# Patient Record
Sex: Female | Born: 1970 | Race: Black or African American | Hispanic: No | Smoking: Light tobacco smoker
Health system: Southern US, Community
[De-identification: ages and names within clinical notes are randomized; demographics above are authoritative.]

## PROBLEM LIST (undated history)

## (undated) DIAGNOSIS — G5601 Carpal tunnel syndrome, right upper limb: Secondary | ICD-10-CM

## (undated) DIAGNOSIS — R7303 Prediabetes: Secondary | ICD-10-CM

## (undated) HISTORY — DX: Carpal tunnel syndrome, right upper limb: G56.01

---

## 1998-03-19 ENCOUNTER — Other Ambulatory Visit: Admission: RE | Admit: 1998-03-19 | Discharge: 1998-03-19 | Payer: Self-pay | Admitting: Obstetrics

## 1998-04-11 ENCOUNTER — Emergency Department (HOSPITAL_COMMUNITY): Admission: EM | Admit: 1998-04-11 | Discharge: 1998-04-11 | Payer: Self-pay | Admitting: Emergency Medicine

## 1998-04-11 ENCOUNTER — Encounter: Payer: Self-pay | Admitting: Emergency Medicine

## 2000-10-01 ENCOUNTER — Emergency Department (HOSPITAL_COMMUNITY): Admission: EM | Admit: 2000-10-01 | Discharge: 2000-10-01 | Payer: Self-pay | Admitting: Emergency Medicine

## 2002-02-12 ENCOUNTER — Emergency Department (HOSPITAL_COMMUNITY): Admission: EM | Admit: 2002-02-12 | Discharge: 2002-02-13 | Payer: Self-pay | Admitting: Emergency Medicine

## 2003-03-22 ENCOUNTER — Emergency Department (HOSPITAL_COMMUNITY): Admission: EM | Admit: 2003-03-22 | Discharge: 2003-03-22 | Payer: Self-pay | Admitting: *Deleted

## 2003-03-22 ENCOUNTER — Encounter: Payer: Self-pay | Admitting: Emergency Medicine

## 2004-09-01 ENCOUNTER — Emergency Department (HOSPITAL_COMMUNITY): Admission: EM | Admit: 2004-09-01 | Discharge: 2004-09-01 | Payer: Self-pay | Admitting: Emergency Medicine

## 2004-09-05 ENCOUNTER — Encounter: Admission: RE | Admit: 2004-09-05 | Discharge: 2004-09-05 | Payer: Self-pay | Admitting: Family Medicine

## 2004-12-09 ENCOUNTER — Other Ambulatory Visit: Admission: RE | Admit: 2004-12-09 | Discharge: 2004-12-09 | Payer: Self-pay | Admitting: *Deleted

## 2006-07-20 ENCOUNTER — Other Ambulatory Visit: Admission: RE | Admit: 2006-07-20 | Discharge: 2006-07-20 | Payer: Self-pay | Admitting: *Deleted

## 2008-01-28 ENCOUNTER — Encounter: Admission: RE | Admit: 2008-01-28 | Discharge: 2008-01-28 | Payer: Self-pay | Admitting: Family Medicine

## 2008-03-01 ENCOUNTER — Other Ambulatory Visit: Admission: RE | Admit: 2008-03-01 | Discharge: 2008-03-01 | Payer: Self-pay | Admitting: Family Medicine

## 2008-03-07 ENCOUNTER — Encounter: Admission: RE | Admit: 2008-03-07 | Discharge: 2008-03-07 | Payer: Self-pay | Admitting: Family Medicine

## 2009-09-13 ENCOUNTER — Encounter: Admission: RE | Admit: 2009-09-13 | Discharge: 2009-09-13 | Payer: Self-pay | Admitting: Family Medicine

## 2009-12-11 ENCOUNTER — Encounter: Admission: RE | Admit: 2009-12-11 | Discharge: 2009-12-11 | Payer: Self-pay | Admitting: Family Medicine

## 2010-01-17 ENCOUNTER — Other Ambulatory Visit: Admission: RE | Admit: 2010-01-17 | Discharge: 2010-01-17 | Payer: Self-pay | Admitting: Family Medicine

## 2011-07-10 ENCOUNTER — Ambulatory Visit (INDEPENDENT_AMBULATORY_CARE_PROVIDER_SITE_OTHER): Payer: BC Managed Care – PPO | Admitting: Family Medicine

## 2011-07-10 VITALS — BP 120/73 | HR 83 | Temp 97.6°F | Resp 18 | Ht 61.0 in | Wt 176.0 lb

## 2011-07-10 DIAGNOSIS — R300 Dysuria: Secondary | ICD-10-CM

## 2011-07-10 DIAGNOSIS — L309 Dermatitis, unspecified: Secondary | ICD-10-CM

## 2011-07-10 DIAGNOSIS — L301 Dyshidrosis [pompholyx]: Secondary | ICD-10-CM

## 2011-07-10 DIAGNOSIS — N760 Acute vaginitis: Secondary | ICD-10-CM

## 2011-07-10 DIAGNOSIS — B9689 Other specified bacterial agents as the cause of diseases classified elsewhere: Secondary | ICD-10-CM

## 2011-07-10 LAB — POCT WET PREP WITH KOH
Epithelial Wet Prep HPF POC: NEGATIVE
KOH Prep POC: NEGATIVE
RBC Wet Prep HPF POC: 0.2
Trichomonas, UA: NEGATIVE
WBC Wet Prep HPF POC: NEGATIVE
Yeast Wet Prep HPF POC: NEGATIVE

## 2011-07-10 LAB — POCT UA - MICROSCOPIC ONLY
Mucus, UA: POSITIVE
Yeast, UA: NEGATIVE

## 2011-07-10 LAB — POCT URINALYSIS DIPSTICK
Blood, UA: NEGATIVE
Protein, UA: NEGATIVE

## 2011-07-10 MED ORDER — METRONIDAZOLE 500 MG PO TABS: 500.0000 mg | ORAL_TABLET | Freq: Two times a day (BID) | ORAL | Status: AC

## 2011-07-10 MED ORDER — CLOBETASOL PROPIONATE 0.05 % EX CREA: TOPICAL_CREAM | Freq: Two times a day (BID) | CUTANEOUS | Status: AC

## 2011-07-10 NOTE — Patient Instructions (Signed)
Treat the eczema on the feet with the prescribed cream. It is not doing better she is to contact me and we would consider antifungal treatment despite normal skin scraping.  The Bacterial Vaginosis Bacterial vaginosis (BV) is a vaginal infection where the normal balance of bacteria in the vagina is disrupted. The normal balance is then replaced by an overgrowth of certain bacteria. There are several different kinds of bacteria that can cause BV. BV is the most common vaginal infection in women of childbearing age. CAUSES   The cause of BV is not fully understood. BV develops when there is an increase or imbalance of harmful bacteria.   Some activities or behaviors can upset the normal balance of bacteria in the vagina and put women at increased risk including:   Having a new sex partner or multiple sex partners.   Douching.   Using an intrauterine device (IUD) for contraception.   It is not clear what role sexual activity plays in the development of BV. However, women that have never had sexual intercourse are rarely infected with BV.  Women do not get BV from toilet seats, bedding, swimming pools or from touching objects around them.  SYMPTOMS   Grey vaginal discharge.   A fish-like odor with discharge, especially after sexual intercourse.   Itching or burning of the vagina and vulva.   Burning or pain with urination.   Some women have no signs or symptoms at all.  DIAGNOSIS  Your caregiver must examine the vagina for signs of BV. Your caregiver will perform lab tests and look at the sample of vaginal fluid through a microscope. They will look for bacteria and abnormal cells (clue cells), a pH test higher than 4.5, and a positive amine test all associated with BV.  RISKS AND COMPLICATIONS   Pelvic inflammatory disease (PID).   Infections following gynecology surgery.   Developing HIV.   Developing herpes virus.  TREATMENT  Sometimes BV will clear up without treatment.  However, all women with symptoms of BV should be treated to avoid complications, especially if gynecology surgery is planned. Female partners generally do not need to be treated. However, BV may spread between female sex partners so treatment is helpful in preventing a recurrence of BV.   BV may be treated with antibiotics. The antibiotics come in either pill or vaginal cream forms. Either can be used with nonpregnant or pregnant women, but the recommended dosages differ. These antibiotics are not harmful to the baby.   BV can recur after treatment. If this happens, a second round of antibiotics will often be prescribed.   Treatment is important for pregnant women. If not treated, BV can cause a premature delivery, especially for a pregnant woman who had a premature birth in the past. All pregnant women who have symptoms of BV should be checked and treated.   For chronic reoccurrence of BV, treatment with a type of prescribed gel vaginally twice a week is helpful.  HOME CARE INSTRUCTIONS   Finish all medication as directed by your caregiver.   Do not have sex until treatment is completed.   Tell your sexual partner that you have a vaginal infection. They should see their caregiver and be treated if they have problems, such as a mild rash or itching.   Practice safe sex. Use condoms. Only have 1 sex partner.  PREVENTION  Basic prevention steps can help reduce the risk of upsetting the natural balance of bacteria in the vagina and developing BV:  Do not have sexual intercourse (be abstinent).   Do not douche.   Use all of the medicine prescribed for treatment of BV, even if the signs and symptoms go away.   Tell your sex partner if you have BV. That way, they can be treated, if needed, to prevent reoccurrence.  SEEK MEDICAL CARE IF:   Your symptoms are not improving after 3 days of treatment.   You have increased discharge, pain, or fever.  MAKE SURE YOU:   Understand these  instructions.   Will watch your condition.   Will get help right away if you are not doing well or get worse.  FOR MORE INFORMATION  Division of STD Prevention (DSTDP), Centers for Disease Control and Prevention: [employer] American Social Health Association (ASHA): www.ashastd.org  Document Released: 05/19/2005 Document Revised: 01/29/2011 Document Reviewed: 11/09/2008 [facility] Patient Information 2012 [place], [place].BV will be treated with oral treatment

## 2011-07-10 NOTE — Progress Notes (Signed)
  Subjective:    Patient ID: Rhonda Goodman, female    DOB: [birth year], 41 y.o.   MRN: [id number]  HPI Patient has had problems for over a week of time initially some dysuria, but that subsided about a week ago. He started developing a vaginal discharge. She used an over-the-counter Monistat preparation without relief. That was less than a week ago. She last had intercourse this weekend, using condom for protection, but not a regular partner. she has a history of being positive for RPR and HSV, and has had gonorrhea in the past. Does not know of any recent STD exposure. The discharge has an odor and has been bothering her.  Also complains of a chronic fungus on her feet    Review of Systems history of chronic constipation which has been more of a problem lately.     Objective:   Physical Exam Afro-American female in no acute distress his time. Abdomen was soft without masses or tenderness. Normal female external genitalia. No obvious erythema. There is a creamy white discharge, looks more nonspecific than yeast in appearance. Cervix appears normal. Bimanual exam was normal with no adnexal or uterine masses or tenderness. Wet prep was taken. Skin scraping was also done space on the bottom of her foot.       Assessment & Plan:  Vaginitis Constipation Rash  Urinalysis wet prep Gen-Probe and skin scraping her foot were performed and are pending.

## 2011-07-11 ENCOUNTER — Encounter: Payer: Self-pay | Admitting: Family Medicine

## 2014-02-24 ENCOUNTER — Ambulatory Visit (INDEPENDENT_AMBULATORY_CARE_PROVIDER_SITE_OTHER): Payer: Self-pay | Admitting: Emergency Medicine

## 2014-02-24 VITALS — BP 122/84 | HR 73 | Temp 98.1°F | Resp 16 | Ht 60.0 in | Wt 199.6 lb

## 2014-02-24 DIAGNOSIS — Z7189 Other specified counseling: Secondary | ICD-10-CM

## 2014-02-24 DIAGNOSIS — Z7185 Encounter for immunization safety counseling: Secondary | ICD-10-CM

## 2014-02-24 DIAGNOSIS — Z23 Encounter for immunization: Secondary | ICD-10-CM

## 2014-02-24 NOTE — Progress Notes (Signed)
   Subjective:    Patient ID: Rhonda Goodman, female    DOB: 1971-04-13, 43 y.o.   MRN: [id number]  This chart was scribed for Lesle Chris, MD by Tonye Royalty, ED Scribe. This patient was seen in room 5 and the patient's care was started at 2:12 PM.   HPI  HPI Comments: Rhonda Goodman is a 43 y.o. female who presents to the [facility] to obtain titer verifications. 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 She states her last tetanus shot was in 1996.   Review of Systems     Objective:   Physical Exam CONSTITUTIONAL: Well developed/well nourished HEAD: Normocephalic/atraumatic EYES: EOMI/PERRL ENMT: Mucous membranes moist NECK: supple no meningeal signs SPINE:entire spine nontender CV: S1/S2 noted, no murmurs/rubs/gallops noted LUNGS: Lungs are clear to auscultation bilaterally, no apparent distress ABDOMEN: soft, nontender, no rebound or guarding GU:no cva tenderness NEURO: Pt is awake/alert, moves all extremitiesx4 EXTREMITIES: pulses normal, full ROM SKIN: warm, color normal PSYCH: no abnormalities of mood noted     Assessment & Plan:  Will check titers for measles, mumps rubella, varicella, and hepatitis B. Tdap and flu shot given today.I personally performed the services described in this documentation, which was scribed in my presence. The recorded information has been reviewed and is accurate.

## 2014-02-25 ENCOUNTER — Ambulatory Visit (INDEPENDENT_AMBULATORY_CARE_PROVIDER_SITE_OTHER): Payer: Self-pay

## 2014-02-25 DIAGNOSIS — Z23 Encounter for immunization: Secondary | ICD-10-CM

## 2014-02-25 LAB — MEASLES/MUMPS/RUBELLA IMMUNITY
Mumps IgG: <5 AU/mL (ref <9.00)
RUBELLA: 6.9 {index} — AB (ref <0.90)

## 2014-02-25 LAB — HEPATITIS B SURFACE ANTIBODY, QUANTITATIVE: Hepatitis B-Post: 34 m[IU]/mL

## 2014-02-25 LAB — VARICELLA ZOSTER ANTIBODY, IGG

## 2014-08-26 ENCOUNTER — Emergency Department (HOSPITAL_COMMUNITY)
Admission: EM | Admit: 2014-08-26 | Discharge: 2014-08-26 | Disposition: A | Discharge status: Home / Self Care | Payer: No Typology Code available for payment source | Attending: Emergency Medicine | Admitting: Emergency Medicine

## 2014-08-26 ENCOUNTER — Encounter (HOSPITAL_COMMUNITY): Payer: Self-pay | Admitting: *Deleted

## 2014-08-26 ENCOUNTER — Emergency Department (HOSPITAL_COMMUNITY): Payer: No Typology Code available for payment source

## 2014-08-26 DIAGNOSIS — Z79899 Other long term (current) drug therapy: Secondary | ICD-10-CM | POA: Insufficient documentation

## 2014-08-26 DIAGNOSIS — S199XXA Unspecified injury of neck, initial encounter: Secondary | ICD-10-CM | POA: Diagnosis present

## 2014-08-26 DIAGNOSIS — Y998 Other external cause status: Secondary | ICD-10-CM | POA: Insufficient documentation

## 2014-08-26 DIAGNOSIS — S161XXA Strain of muscle, fascia and tendon at neck level, initial encounter: Secondary | ICD-10-CM

## 2014-08-26 DIAGNOSIS — Y9389 Activity, other specified: Secondary | ICD-10-CM | POA: Insufficient documentation

## 2014-08-26 DIAGNOSIS — Z720 Tobacco use: Secondary | ICD-10-CM | POA: Diagnosis not present

## 2014-08-26 DIAGNOSIS — Y92410 Unspecified street and highway as the place of occurrence of the external cause: Secondary | ICD-10-CM | POA: Insufficient documentation

## 2014-08-26 DIAGNOSIS — Z792 Long term (current) use of antibiotics: Secondary | ICD-10-CM | POA: Insufficient documentation

## 2014-08-26 DIAGNOSIS — S29012A Strain of muscle and tendon of back wall of thorax, initial encounter: Secondary | ICD-10-CM | POA: Diagnosis not present

## 2014-08-26 MED ORDER — NAPROXEN 500 MG PO TABS: 500.0000 mg | ORAL_TABLET | Freq: Two times a day (BID) | ORAL | Status: DC

## 2014-08-26 MED ORDER — CYCLOBENZAPRINE HCL 10 MG PO TABS: 10.0000 mg | ORAL_TABLET | Freq: Three times a day (TID) | ORAL | Status: DC | PRN
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
## 2015-03-08 ENCOUNTER — Ambulatory Visit: Payer: Self-pay | Admitting: Physician Assistant

## 2015-03-08 ENCOUNTER — Encounter: Payer: Self-pay | Admitting: Physician Assistant

## 2015-03-08 VITALS — BP 138/80 | Temp 97.8°F | Wt 203.0 lb

## 2015-03-08 DIAGNOSIS — M7052 Other bursitis of knee, left knee: Secondary | ICD-10-CM

## 2015-03-08 DIAGNOSIS — M25562 Pain in left knee: Secondary | ICD-10-CM

## 2015-03-08 MED ORDER — MELOXICAM 15 MG PO TABS: 15.0000 mg | ORAL_TABLET | Freq: Every day | ORAL | Status: DC

## 2015-03-08 MED ORDER — DICLOFENAC SODIUM 1 % TD GEL: 4.0000 g | Freq: Four times a day (QID) | TRANSDERMAL | Status: DC

## 2015-03-08 NOTE — Progress Notes (Signed)
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

## 2015-06-15 DIAGNOSIS — M25562 Pain in left knee: Secondary | ICD-10-CM | POA: Diagnosis not present

## 2015-06-15 DIAGNOSIS — M79671 Pain in right foot: Secondary | ICD-10-CM | POA: Diagnosis not present

## 2015-06-21 DIAGNOSIS — Z01411 Encounter for gynecological examination (general) (routine) with abnormal findings: Secondary | ICD-10-CM | POA: Diagnosis not present

## 2015-06-21 DIAGNOSIS — Z1231 Encounter for screening mammogram for malignant neoplasm of breast: Secondary | ICD-10-CM | POA: Diagnosis not present

## 2015-06-21 DIAGNOSIS — Z01419 Encounter for gynecological examination (general) (routine) without abnormal findings: Secondary | ICD-10-CM | POA: Diagnosis not present

## 2015-06-21 DIAGNOSIS — Z1329 Encounter for screening for other suspected endocrine disorder: Secondary | ICD-10-CM | POA: Diagnosis not present

## 2015-06-21 DIAGNOSIS — E669 Obesity, unspecified: Secondary | ICD-10-CM | POA: Diagnosis not present

## 2015-06-21 DIAGNOSIS — Z13220 Encounter for screening for lipoid disorders: Secondary | ICD-10-CM | POA: Diagnosis not present

## 2015-06-21 DIAGNOSIS — R5383 Other fatigue: Secondary | ICD-10-CM | POA: Diagnosis not present

## 2015-06-21 DIAGNOSIS — Z6841 Body Mass Index (BMI) 40.0 and over, adult: Secondary | ICD-10-CM | POA: Diagnosis not present

## 2015-06-21 DIAGNOSIS — Z131 Encounter for screening for diabetes mellitus: Secondary | ICD-10-CM | POA: Diagnosis not present

## 2015-06-21 DIAGNOSIS — N760 Acute vaginitis: Secondary | ICD-10-CM | POA: Diagnosis not present

## 2015-06-21 DIAGNOSIS — Z1321 Encounter for screening for nutritional disorder: Secondary | ICD-10-CM | POA: Diagnosis not present

## 2015-06-21 DIAGNOSIS — Z124 Encounter for screening for malignant neoplasm of cervix: Secondary | ICD-10-CM | POA: Diagnosis not present

## 2015-06-25 DIAGNOSIS — Z0100 Encounter for examination of eyes and vision without abnormal findings: Secondary | ICD-10-CM | POA: Diagnosis not present

## 2015-08-23 ENCOUNTER — Ambulatory Visit (INDEPENDENT_AMBULATORY_CARE_PROVIDER_SITE_OTHER): Payer: 59

## 2015-08-23 ENCOUNTER — Encounter: Payer: Self-pay | Admitting: Podiatry

## 2015-08-23 ENCOUNTER — Ambulatory Visit (INDEPENDENT_AMBULATORY_CARE_PROVIDER_SITE_OTHER): Payer: 59 | Admitting: Podiatry

## 2015-08-23 VITALS — BP 114/76 | HR 87 | Resp 18

## 2015-08-23 DIAGNOSIS — M898X9 Other specified disorders of bone, unspecified site: Secondary | ICD-10-CM | POA: Diagnosis not present

## 2015-08-23 DIAGNOSIS — M2011 Hallux valgus (acquired), right foot: Secondary | ICD-10-CM | POA: Diagnosis not present

## 2015-08-23 DIAGNOSIS — R52 Pain, unspecified: Secondary | ICD-10-CM

## 2015-08-23 DIAGNOSIS — M7730 Calcaneal spur, unspecified foot: Secondary | ICD-10-CM

## 2015-08-23 DIAGNOSIS — M722 Plantar fascial fibromatosis: Secondary | ICD-10-CM | POA: Diagnosis not present

## 2015-08-23 DIAGNOSIS — M2010 Hallux valgus (acquired), unspecified foot: Secondary | ICD-10-CM | POA: Insufficient documentation

## 2015-08-23 NOTE — Patient Instructions (Signed)

## 2015-08-23 NOTE — Progress Notes (Signed)
Subjective:    Patient ID: Rhonda Goodman, female    DOB: 1970-08-23, 45 y.o.   MRN: [id number]  HPI  45 year old female presents the office concerns of bilateral heel pain which is been ongoing for approximate one year and worsening last several months. She previously signed orthopedics and she was prescribed meloxicam which seems to help but she does not take pills. She is also ordered physical therapy but she has not been going as she is currently in school and working in the ER. She has pain in the morning or after periods of rest which is relieved by ambulation. She is also purchase new tennis shoes as well as insert which have helped some. She gets some occasional discomfort to the bunion on the right side. She denies any numbness or tingling. No swelling or redness. The pain does not wake her up at night. No other complaints at this time.   Review of Systems  All other systems reviewed and are negative.      Objective:   Physical Exam General: AAO x3, NAD  Dermatological: Skin is warm, dry and supple bilateral. Nails x 10 are well manicured; remaining integument appears unremarkable at this time. There are no open sores, no preulcerative lesions, no rash or signs of infection present.  Vascular: Dorsalis Pedis artery and Posterior Tibial artery pedal pulses are 2/4 bilateral with immedate capillary fill time. Pedal hair growth present. No varicosities and no lower extremity edema present bilateral. There is no pain with calf compression, swelling, warmth, erythema.   Neruologic: Grossly intact via light touch bilateral. Vibratory intact via tuning fork bilateral. Protective threshold with Semmes Wienstein monofilament intact to all pedal sites bilateral. Patellar and Achilles deep tendon reflexes 2+ bilateral. No Babinski or clonus noted bilateral.   Musculoskeletal: Tenderness to palpation along the plantar medial tubercle of the calcaneus at the insertion of plantar fascia on  the right >> left foot. There is no pain along the course of the plantar fascia within the arch of the foot. Plantar fascia appears to be intact. There is no pain with lateral compression of the calcaneus or pain with vibratory sensation. Also mild to moderate HAV present in the dorsal exostosis of the first MTPJ on the right side. There is no pain along the course or insertion of the achilles tendon. No other areas of tenderness to bilateral lower extremities.   Gait: Unassisted, Nonantalgic.      Assessment & Plan:  45 year old female bilateral heel pain, plantar fasciitis/heel spur right greater than left; HAV/hallux limitus right foot -Treatment options discussed including all alternatives, risks, and complications -X-rays were obtained and reviewed with the patient.  -Etiology of symptoms were discussed -Patient elects to proceed with steroid injection into the left and right heel. Under sterile skin preparation, a total of 2.5cc of kenalog 10, 0.5% Marcaine plain, and 2% lidocaine plain were infiltrated into the symptomatic area without complication. A band-aid was applied. Patient tolerated the injection well without complication. Post-injection care with discussed with the patient. Discussed with the patient to ice the area over the next couple of days to help prevent a steroid flare.  -Bilateral plantar fascial braces were dispensed. -Continue meloxicam. Discussed side effects. -Stretching and icing daily. -Continue supportive shoe gear. She brought Shon Baton as well as an over-the-counter inserts which seems to help. -Discussed shoe gear modifications, padding offloading to the bunion. She wanted to discuss surgical intervention we discussed this in the future if she desires. -Follow-up in 3  weeks or sooner if any problems arise. In the meantime, encouraged to call the office with any questions, concerns, change in symptoms.   Ovid CurdMatthew Wagoner, DPM

## 2015-08-24 ENCOUNTER — Telehealth: Payer: Self-pay | Admitting: Podiatry

## 2015-08-24 ENCOUNTER — Ambulatory Visit
Admission: RE | Admit: 2015-08-24 | Discharge: 2015-08-24 | Disposition: A | Discharge status: Home / Self Care | Payer: 59 | Source: Ambulatory Visit | Attending: Podiatry | Admitting: Podiatry

## 2015-08-24 ENCOUNTER — Telehealth: Payer: Self-pay | Admitting: *Deleted

## 2015-08-24 DIAGNOSIS — I82401 Acute embolism and thrombosis of unspecified deep veins of right lower extremity: Secondary | ICD-10-CM

## 2015-08-24 DIAGNOSIS — M79661 Pain in right lower leg: Secondary | ICD-10-CM

## 2015-08-24 DIAGNOSIS — R609 Edema, unspecified: Secondary | ICD-10-CM | POA: Insufficient documentation

## 2015-08-24 DIAGNOSIS — M7989 Other specified soft tissue disorders: Secondary | ICD-10-CM | POA: Diagnosis not present

## 2015-08-24 DIAGNOSIS — M79604 Pain in right leg: Secondary | ICD-10-CM | POA: Diagnosis not present

## 2015-08-24 NOTE — Telephone Encounter (Signed)
Ice, NSAIDs. Please order a venous ultrasound although clot is unlikely

## 2015-08-24 NOTE — Telephone Encounter (Addendum)
Pt states calf is stiff all the way to the thigh after the injection 08/23/2015.  08/29/2015-Pt called for results of dopplers.  I left message informing pt I didn't want her to worry the doppler results were negative per Dr. Gabriel RungWagoner's review.

## 2015-08-24 NOTE — Telephone Encounter (Signed)
Pt called in stating she received a shot in her foot and now her right leg is stiff from her calf to her thigh. Wanting to know if this suppose to be a side effect from the shot. If you could give her a call back to inform her about this     Thank You

## 2015-08-28 NOTE — Telephone Encounter (Signed)
Dr. Ardelle AntonWagoner reviewed pt's 08/24/2015 venous doppler results as negative for DVT.  Left message to call for results.

## 2015-09-18 ENCOUNTER — Ambulatory Visit: Payer: 59 | Admitting: Podiatry

## 2015-10-17 DIAGNOSIS — M1711 Unilateral primary osteoarthritis, right knee: Secondary | ICD-10-CM | POA: Diagnosis not present

## 2015-10-17 DIAGNOSIS — R262 Difficulty in walking, not elsewhere classified: Secondary | ICD-10-CM | POA: Diagnosis not present

## 2015-10-17 DIAGNOSIS — M25561 Pain in right knee: Secondary | ICD-10-CM | POA: Diagnosis not present

## 2015-10-17 DIAGNOSIS — M170 Bilateral primary osteoarthritis of knee: Secondary | ICD-10-CM | POA: Diagnosis not present

## 2015-10-24 DIAGNOSIS — R7303 Prediabetes: Secondary | ICD-10-CM | POA: Diagnosis not present

## 2015-10-24 DIAGNOSIS — E559 Vitamin D deficiency, unspecified: Secondary | ICD-10-CM | POA: Diagnosis not present

## 2015-10-24 DIAGNOSIS — N760 Acute vaginitis: Secondary | ICD-10-CM | POA: Diagnosis not present

## 2015-10-31 DIAGNOSIS — M1711 Unilateral primary osteoarthritis, right knee: Secondary | ICD-10-CM | POA: Diagnosis not present

## 2015-10-31 DIAGNOSIS — M25561 Pain in right knee: Secondary | ICD-10-CM | POA: Diagnosis not present

## 2016-01-14 ENCOUNTER — Ambulatory Visit (INDEPENDENT_AMBULATORY_CARE_PROVIDER_SITE_OTHER): Payer: 59 | Admitting: Family Medicine

## 2016-01-14 VITALS — BP 122/80 | HR 88 | Temp 98.7°F | Resp 18 | Ht 60.0 in | Wt 175.0 lb

## 2016-01-14 DIAGNOSIS — R05 Cough: Secondary | ICD-10-CM | POA: Diagnosis not present

## 2016-01-14 DIAGNOSIS — R059 Cough, unspecified: Secondary | ICD-10-CM

## 2016-01-14 DIAGNOSIS — J069 Acute upper respiratory infection, unspecified: Secondary | ICD-10-CM | POA: Diagnosis not present

## 2016-01-14 DIAGNOSIS — R0981 Nasal congestion: Secondary | ICD-10-CM

## 2016-01-14 MED ORDER — CETIRIZINE HCL 10 MG PO TABS: 10.0000 mg | ORAL_TABLET | Freq: Every day | ORAL | 11 refills | Status: DC

## 2016-01-14 MED ORDER — FLUTICASONE PROPIONATE 50 MCG/ACT NA SUSP: 2.0000 | Freq: Every day | NASAL | 12 refills | Status: DC

## 2016-01-14 MED ORDER — BENZONATATE 100 MG PO CAPS: 100.0000 mg | ORAL_CAPSULE | Freq: Three times a day (TID) | ORAL | 0 refills | Status: DC | PRN

## 2016-01-14 NOTE — Progress Notes (Signed)
6  Patient ID: Rhonda MaxcyAlissa M Goodman, female    DOB: 11/14/1970, 45 y.o.   MRN: [id number]  PCP: No PCP Per Patient  Chief Complaint  Patient presents with  . Cough  . Nasal Congestion  . Headache    Subjective:   HPI Presents for evaluation of nasal congestion and headache time 3 days. Low grade fever 99.0 F Mucinex DM and Robitussin without much relief Coughing times 1 day, mucus production yellowish and green Early Saturday scratchy throat  No nausea or vomiting.  . Social History   Social History  . Marital status: Single    Spouse name: N/A  . Number of children: N/A  . Years of education: N/A   Occupational History  . Not on file.   Social History Main Topics  . Smoking status: Current Some Day Smoker  . Smokeless tobacco: Never Used  . Alcohol use Yes     Comment: socially, once a month  . Drug use: No  . Sexual activity: Yes   Other Topics Concern  . Not on file   Social History Narrative  . No narrative on file   . Family History  Problem Relation Age of Onset  . Leukemia Mother   . Hypertension Father     Review of Systems  Constitutional: Positive for fatigue.  HENT: Positive for congestion, sinus pressure and sneezing.        See HPI  Respiratory: Positive for cough.        See HPI  Cardiovascular: Negative.   Skin: Negative.    Patient Active Problem List   Diagnosis Date Noted  . Heel spur 08/23/2015  . Plantar fasciitis 08/23/2015  . 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 Authorizing Provider  meloxicam (MOBIC) 15 MG tablet Take 1 tablet (15 mg total) by mouth daily. 03/08/15  Yes Faythe GheeSusan W Fisher, PA-C  Multiple Vitamins-Minerals (ALIVE ONCE DAILY WOMENS 50+ PO) Take 1 capsule by mouth daily.   Yes Historical Provider, MD  cyclobenzaprine (FLEXERIL) 10 MG tablet Take 1 tablet (10 mg total) by mouth every 8 (eight) hours as  needed for muscle spasms. Patient not taking: Reported on 03/08/2015 08/26/14   Kathrynn Speedobyn M Hess, PA-C  diclofenac sodium (VOLTAREN) 1 % GEL Apply 4 g topically 4 (four) times daily. Patient not taking: Reported on 01/14/2016 03/08/15   Faythe GheeSusan W Fisher, PA-C     No Known Allergies     Objective:  Physical Exam  Constitutional: She is oriented to person, place, and time. She appears well-developed and well-nourished.  HENT:  Head: Normocephalic and atraumatic.  Right Ear: External ear normal.  Left Ear: External ear normal.  Mouth/Throat: Oropharynx is clear and moist.  Eyes: Conjunctivae and EOM are normal. Pupils are equal, round, and reactive to light.  Neck: Normal range of motion. Neck supple.  Cardiovascular: Normal rate, regular rhythm and normal heart sounds.   Pulmonary/Chest: Effort normal and breath sounds normal.  Musculoskeletal: Normal range of motion.  Neurological: She is alert and oriented to person, place, and time.  Skin: Skin is warm.    Vitals:   01/14/16 1020  BP: 122/80  Pulse: 88  Resp: 18  Temp: 98.7 F (37.1 C)     Assessment & Plan:  1. Cough . benzonatate (TESSALON) 100 MG capsule    Sig: Take 1-2 capsules (100-200 mg total) by mouth 3 (  three) times daily as needed for cough.   2. Nasal congestion . fluticasone (FLONASE) 50 MCG/ACT nasal spray    Sig: Place 2 sprays into both nostrils daily.   3. Acute upper respiratory infection . cetirizine (ZYRTEC) 10 MG tablet    Sig: Take 1 tablet (10 mg total) by mouth daily.    Follow-up as needed.  Godfrey PickKimberly S. Tiburcio PeaHarris, MSN, FNP-C [facility]

## 2016-01-14 NOTE — Patient Instructions (Addendum)
Follow-up as needed!   Nice to meet you!  Godfrey Pick. Kimberly S. Tiburcio PeaHarris, MSN, FNP-C [facility]   IF you received an x-ray today, you will receive an invoice from [facility]. Please contact [facility] at [phone] with questions or concerns regarding your invoice.   IF you received labwork today, you will receive an invoice from [facility]. Please contact [facility] at [phone] with questions or concerns regarding your invoice.   Our billing staff will not be able to assist you with questions regarding bills from these companies.  You will be contacted with the lab results as soon as they are available. The fastest way to get your results is to activate your My Chart account. Instructions are located on the last page of this paperwork. If you have not heard from us regarding the results in 2 weeks, please contact this office.     Upper Respiratory Infection, Adult Most upper respiratory infections (URIs) are a viral infection of the air passages leading to the lungs. A URI affects the nose, throat, and upper air passages. The most common type of URI is nasopharyngitis and is typically referred to as "the common cold." URIs run their course and usually go away on their own. Most of the time, a URI does not require medical attention, but sometimes a bacterial infection in the upper airways can follow a viral infection. This is called a secondary infection. Sinus and middle ear infections are common types of secondary upper respiratory infections. Bacterial pneumonia can also complicate a URI. A URI can worsen asthma and chronic obstructive pulmonary disease (COPD). Sometimes, these complications can require emergency medical care and may be life threatening.  CAUSES Almost all URIs are caused by viruses. A virus is a type of germ and can spread from one person to another.  RISKS FACTORS You may be at risk  for a URI if:   You smoke.   You have chronic heart or lung disease.  You have a weakened defense (immune) system.   You are very young or very old.   You have nasal allergies or asthma.  You work in crowded or poorly ventilated areas.  You work in health care facilities or schools. SIGNS AND SYMPTOMS  Symptoms typically develop 2-3 days after you come in contact with a cold virus. Most viral URIs last 7-10 days. However, viral URIs from the influenza virus (flu virus) can last 14-18 days and are typically more severe. Symptoms may include:   Runny or stuffy (congested) nose.   Sneezing.   Cough.   Sore throat.   Headache.   Fatigue.   Fever.   Loss of appetite.   Pain in your forehead, behind your eyes, and over your cheekbones (sinus pain).  Muscle aches.  DIAGNOSIS  Your health care provider may diagnose a URI by:  Physical exam.  Tests to check that your symptoms are not due to another condition such as:  Strep throat.  Sinusitis.  Pneumonia.  Asthma. TREATMENT  A URI goes away on its own with time. It cannot be cured with medicines, but medicines may be prescribed or recommended to relieve symptoms. Medicines may help:  Reduce your fever.  Reduce your cough.  Relieve nasal congestion. HOME CARE INSTRUCTIONS   Take medicines only as directed by your health care provider.   Gargle warm saltwater or take cough drops to comfort your throat as directed by your health care provider.  Use a warm mist humidifier or inhale steam from a shower to increase air moisture. This may make it easier to breathe.  Drink enough fluid to keep your urine clear or pale yellow.   Eat soups and other clear broths and maintain good nutrition.   Rest as needed.   Return to work when your temperature has returned to normal or as your health care provider advises. You may need to stay home longer to avoid infecting others. You can also use a face mask  and careful hand washing to prevent spread of the virus.  Increase the usage of your inhaler if you have asthma.   Do not use any tobacco products, including cigarettes, chewing tobacco, or electronic cigarettes. If you need help quitting, ask your health care provider. PREVENTION  The best way to protect yourself from getting a cold is to practice good hygiene.   Avoid oral or hand contact with people with cold symptoms.   Wash your hands often if contact occurs.  There is no clear evidence that vitamin C, vitamin E, echinacea, or exercise reduces the chance of developing a cold. However, it is always recommended to get plenty of rest, exercise, and practice good nutrition.  SEEK MEDICAL CARE IF:   You are getting worse rather than better.   Your symptoms are not controlled by medicine.   You have chills.  You have worsening shortness of breath.  You have brown or red mucus.  You have yellow or brown nasal discharge.  You have pain in your face, especially when you bend forward.  You have a fever.  You have swollen neck glands.  You have pain while swallowing.  You have white areas in the back of your throat. SEEK IMMEDIATE MEDICAL CARE IF:   You have severe or persistent:  Headache.  Ear pain.  Sinus pain.  Chest pain.  You have chronic lung disease and any of the following:  Wheezing.  Prolonged cough.  Coughing up blood.  A change in your usual mucus.  You have a stiff neck.  You have changes in your:  Vision.  Hearing.  Thinking.  Mood. MAKE SURE YOU:   Understand these instructions.  Will watch your condition.  Will get help right away if you are not doing well or get worse.   This information is not intended to replace advice given to you by your health care provider. Make sure you discuss any questions you have with your health care provider.   Document Released: 11/12/2000 Document Revised: 10/03/2014 Document Reviewed:  08/24/2013 Elsevier Interactive Patient Education [employer]2016 Elsevier Inc.

## 2016-01-21 ENCOUNTER — Telehealth: Payer: Self-pay

## 2016-01-21 NOTE — Telephone Encounter (Signed)
Patient needs FMLA forms completed by Joaquin CourtsKimberly Goodman for her URI, I have completed what I could from the OV notes and highlighted the areas that need to be updated.I will place the forms in your box on 01/21/16 if you could please complete them and return them to the FMLA/Disability box at the 102 checkout desk within 5-7 business days. Thank you!

## 2016-01-24 NOTE — Telephone Encounter (Signed)
Paperwork scanned and faxed to [employer] on 01/24/16

## 2016-01-25 ENCOUNTER — Ambulatory Visit (INDEPENDENT_AMBULATORY_CARE_PROVIDER_SITE_OTHER): Payer: 59 | Admitting: Family Medicine

## 2016-01-25 ENCOUNTER — Encounter: Payer: Self-pay | Admitting: Family Medicine

## 2016-01-25 VITALS — BP 110/72 | HR 72 | Temp 98.3°F | Resp 18 | Ht 60.0 in | Wt 179.0 lb

## 2016-01-25 DIAGNOSIS — J019 Acute sinusitis, unspecified: Secondary | ICD-10-CM

## 2016-01-25 DIAGNOSIS — R350 Frequency of micturition: Secondary | ICD-10-CM

## 2016-01-25 LAB — POCT URINALYSIS DIP (MANUAL ENTRY)
Bilirubin, UA: NEGATIVE
Blood, UA: NEGATIVE
GLUCOSE UA: NEGATIVE
LEUKOCYTES UA: NEGATIVE
Nitrite, UA: NEGATIVE
PROTEIN UA: NEGATIVE
SPEC GRAV UA: 1.015
Urobilinogen, UA: 0.2
pH, UA: 6

## 2016-01-25 LAB — POC MICROSCOPIC URINALYSIS (UMFC): MUCUS RE: ABSENT

## 2016-01-25 MED ORDER — FLUCONAZOLE 150 MG PO TABS: 150.0000 mg | ORAL_TABLET | Freq: Once | ORAL | 0 refills | Status: AC

## 2016-01-25 MED ORDER — PHENAZOPYRIDINE HCL 200 MG PO TABS: 200.0000 mg | ORAL_TABLET | Freq: Three times a day (TID) | ORAL | 0 refills | Status: DC | PRN

## 2016-01-25 MED ORDER — AMOXICILLIN-POT CLAVULANATE 875-125 MG PO TABS: 1.0000 | ORAL_TABLET | Freq: Two times a day (BID) | ORAL | 0 refills | Status: DC

## 2016-01-25 MED ORDER — FLUCONAZOLE 150 MG PO TABS: 150.0000 mg | ORAL_TABLET | Freq: Once | ORAL | 0 refills | Status: DC

## 2016-01-25 NOTE — Progress Notes (Signed)
Patient ID: Rhonda Goodman, female    DOB: [birth year], 45 y.o.   MRN: [id number]  PCP: No PCP Per Patient  Chief Complaint  Patient presents with  . Follow-up  . Sinusitis    Subjective:   HPI Presents for evaluation of sinus symptoms times two weeks.  45 year old female presents today with continued cough and significant congestion 2 weeks.  Patient was previously seen in the office with upper respiratory symptoms and congestion on August 14.  She was treated for cough and congestion.  She reports mild improvement of symptoms followed by  sudden worsening of symptoms about 3-4 days after.  Today she reports headache facial pain nasal drainage and productive cough.  She reports a non-measurable fever 1 day. She has also missed about 3 days of work.  She reports her employer will be faxing over paperwork to have a work absence excuse completed as previous paperwork did not indicate length of illness.   Urinary Frequency Patient reports acute onset of frequent urination with a foul urine odor 1 day. He requests to be screened for urinary tract infection.  . Social History   Social History  . Marital status: Single    Spouse name: N/A  . Number of children: N/A  . Years of education: N/A   Occupational History  . Not on file.   Social History Main Topics  . Smoking status: Current Some Day Smoker  . Smokeless tobacco: Never Used  . Alcohol use Yes     Comment: socially, once a month  . Drug use: No  . Sexual activity: Yes   Other Topics Concern  . Not on file   Social History Narrative  . No narrative on file    . Family History  Problem Relation Age of Onset  . Leukemia Mother   . Hypertension Father     Review of Systems  Constitutional: Positive for fatigue. Negative for fever and unexpected weight change.  HENT: Positive for congestion, sinus pressure and sneezing.   Respiratory: Positive for cough.   Cardiovascular: Negative.     Gastrointestinal: Negative.   Endocrine: Positive for polyphagia.  Genitourinary: Positive for urgency.       Urine odor  Neurological: Positive for headaches.      Patient Active Problem List   Diagnosis Date Noted  . Heel spur 08/23/2015  . Plantar fasciitis 08/23/2015  . 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 Authorizing Provider  benzonatate (TESSALON) 100 MG capsule Take 1-2 capsules (100-200 mg total) by mouth 3 (three) times daily as needed for cough. 01/14/16  Yes Doyle Askew, FNP  cetirizine (ZYRTEC) 10 MG tablet Take 1 tablet (10 mg total) by mouth daily. 01/14/16  Yes Doyle Askew, FNP  fluticasone (FLONASE) 50 MCG/ACT nasal spray Place 2 sprays into both nostrils daily. 01/14/16  Yes Doyle Askew, FNP  Multiple Vitamins-Minerals (ALIVE ONCE DAILY WOMENS 50+ PO) Take 1 capsule by mouth daily.   Yes Historical Provider, MD  cyclobenzaprine (FLEXERIL) 10 MG tablet Take 1 tablet (10 mg total) by mouth every 8 (eight) hours as needed for muscle spasms. Patient not taking: Reported on 01/25/2016 08/26/14   Kathrynn Speed, PA-C   No Known Allergies     Objective:  Physical Exam  Constitutional: She is oriented to person, place, and time. She appears well-developed and well-nourished.  HENT:  Head: Normocephalic and atraumatic.  Right Ear: External ear normal.  Left Ear: External ear normal.  Mouth/Throat: Oropharynx is clear and moist.  Tenderness with palpation of the frontal and maxillary sinuses.  Eyes: Pupils are equal, round, and reactive to light.  Neck: Normal range of motion.  Pulmonary/Chest: Breath sounds normal.  Mild rhonchi ausculted in upper bronchials  Musculoskeletal: Normal range of motion.  Lymphadenopathy:    She has no cervical adenopathy.  Neurological: She is alert and oriented to person, place, and  time.  Skin: Skin is warm and dry.     . Vitals:   01/25/16 1615  BP: 110/72  Pulse: 72  Resp: 18  Temp: 98.3 F (36.8 C)       Assessment & Plan:  .1. Urine frequency, patient reports urinary frequency with malodorous urine times 1 day. - POCT urinalysis dipstick - POCT Microscopic Urinalysis (UMFC) Plan: Take pyridine 200 mg as needed for urinary discomfort.  2. Acute sinusitis, recurrence not specified, unspecified location Patient was originally treated symptomatically for a cough with some minor nasal congestion. She is sent separately presented to the clinic with headache worsening nasal condition congestion which is painful to touch and productive cough after a brief improvement of symptoms.  Plan: Take Augmentin 875 mg-125 mg twice daily for 10 days. Diflucan 150 mg provided for candidal infection prophylaxis.  Godfrey PickKimberly S. Tiburcio PeaHarris, MSN, FNP-C [facility]

## 2016-01-25 NOTE — Patient Instructions (Addendum)
Take Augmentin for 10 days twice daily for sinus infection.  I've ordered a Diflucan in the event you develop a yeast infection from the antibiotic.  Your urine was clear.  You may take pyridium as needed for urinary discomfort.      IF you received an x-ray today, you will receive an invoice from [facility]. Please contact [facility] at [phone] with questions or concerns regarding your invoice.   IF you received labwork today, you will receive an invoice from [facility]. Please contact [facility] at [phone] with questions or concerns regarding your invoice.   Our billing staff will not be able to assist you with questions regarding bills from these companies.  You will be contacted with the lab results as soon as they are available. The fastest way to get your results is to activate your My Chart account. Instructions are located on the last page of this paperwork. If you have not heard from us regarding the results in 2 weeks, please contact this office.     Sinusitis, Adult Sinusitis is redness, soreness, and puffiness (inflammation) of the air pockets in the bones of your face (sinuses). The redness, soreness, and puffiness can cause air and mucus to get trapped in your sinuses. This can allow germs to grow and cause an infection.  HOME CARE   Drink enough fluids to keep your pee (urine) clear or pale yellow.  Use a humidifier in your home.  Run a hot shower to create steam in the bathroom. Sit in the bathroom with the door closed. Breathe in the steam 3-4 times a day.  Put a warm, moist washcloth on your face 3-4 times a day, or as told by your doctor.  Use salt water sprays (saline sprays) to wet the thick fluid in your nose. This can help the sinuses drain.  Only take medicine as told by your doctor. GET HELP RIGHT AWAY IF:   Your pain gets worse.  You have very bad headaches.  You are sick to your stomach  (nauseous).  You throw up (vomit).  You are very sleepy (drowsy) all the time.  Your face is puffy (swollen).  Your vision changes.  You have a stiff neck.  You have trouble breathing. MAKE SURE YOU:   Understand these instructions.  Will watch your condition.  Will get help right away if you are not doing well or get worse.   This information is not intended to replace advice given to you by your health care provider. Make sure you discuss any questions you have with your health care provider.   Document Released: 11/05/2007 Document Revised: 06/09/2014 Document Reviewed: 12/23/2011 Elsevier Interactive Patient Education [employer]2016 Elsevier Inc.

## 2016-01-27 LAB — URINE CULTURE: Organism ID, Bacteria: NO GROWTH

## 2016-01-31 DIAGNOSIS — N760 Acute vaginitis: Secondary | ICD-10-CM | POA: Diagnosis not present

## 2016-01-31 DIAGNOSIS — Z113 Encounter for screening for infections with a predominantly sexual mode of transmission: Secondary | ICD-10-CM | POA: Diagnosis not present

## 2016-02-01 DIAGNOSIS — R7309 Other abnormal glucose: Secondary | ICD-10-CM | POA: Diagnosis not present

## 2016-02-01 DIAGNOSIS — R634 Abnormal weight loss: Secondary | ICD-10-CM | POA: Diagnosis not present

## 2016-02-01 DIAGNOSIS — T65222A Toxic effect of tobacco cigarettes, intentional self-harm, initial encounter: Secondary | ICD-10-CM | POA: Diagnosis not present

## 2016-02-01 DIAGNOSIS — J22 Unspecified acute lower respiratory infection: Secondary | ICD-10-CM | POA: Diagnosis not present

## 2016-02-05 ENCOUNTER — Other Ambulatory Visit: Payer: Self-pay | Admitting: Family Medicine

## 2016-02-05 ENCOUNTER — Ambulatory Visit
Admission: RE | Admit: 2016-02-05 | Discharge: 2016-02-05 | Disposition: A | Discharge status: Home / Self Care | Payer: 59 | Source: Ambulatory Visit | Attending: Family Medicine | Admitting: Family Medicine

## 2016-02-05 DIAGNOSIS — J22 Unspecified acute lower respiratory infection: Secondary | ICD-10-CM

## 2016-02-05 DIAGNOSIS — R634 Abnormal weight loss: Secondary | ICD-10-CM

## 2016-02-05 DIAGNOSIS — R05 Cough: Secondary | ICD-10-CM | POA: Diagnosis not present

## 2016-02-05 DIAGNOSIS — R059 Cough, unspecified: Secondary | ICD-10-CM

## 2016-02-05 DIAGNOSIS — R0602 Shortness of breath: Secondary | ICD-10-CM | POA: Diagnosis not present

## 2016-02-25 DIAGNOSIS — J22 Unspecified acute lower respiratory infection: Secondary | ICD-10-CM | POA: Diagnosis not present

## 2016-02-25 DIAGNOSIS — Z716 Tobacco abuse counseling: Secondary | ICD-10-CM | POA: Diagnosis not present

## 2016-04-18 DIAGNOSIS — J22 Unspecified acute lower respiratory infection: Secondary | ICD-10-CM | POA: Diagnosis not present

## 2016-04-18 DIAGNOSIS — J399 Disease of upper respiratory tract, unspecified: Secondary | ICD-10-CM | POA: Diagnosis not present

## 2016-06-05 ENCOUNTER — Ambulatory Visit: Payer: 59 | Admitting: Podiatry

## 2016-07-11 DIAGNOSIS — N760 Acute vaginitis: Secondary | ICD-10-CM | POA: Diagnosis not present

## 2016-07-25 ENCOUNTER — Ambulatory Visit (INDEPENDENT_AMBULATORY_CARE_PROVIDER_SITE_OTHER): Payer: 59

## 2016-07-25 ENCOUNTER — Ambulatory Visit (INDEPENDENT_AMBULATORY_CARE_PROVIDER_SITE_OTHER): Payer: 59 | Admitting: Podiatry

## 2016-07-25 ENCOUNTER — Encounter: Payer: Self-pay | Admitting: Podiatry

## 2016-07-25 DIAGNOSIS — M6528 Calcific tendinitis, other site: Secondary | ICD-10-CM

## 2016-07-25 DIAGNOSIS — M7660 Achilles tendinitis, unspecified leg: Secondary | ICD-10-CM | POA: Diagnosis not present

## 2016-07-25 DIAGNOSIS — M7751 Other enthesopathy of right foot: Secondary | ICD-10-CM

## 2016-07-25 DIAGNOSIS — M9261 Juvenile osteochondrosis of tarsus, right ankle: Secondary | ICD-10-CM | POA: Diagnosis not present

## 2016-07-29 MED ORDER — BETAMETHASONE SOD PHOS & ACET 6 (3-3) MG/ML IJ SUSP: 3.0000 mg | Freq: Once | INTRAMUSCULAR | Status: DC

## 2016-07-29 MED ORDER — MELOXICAM 15 MG PO TABS: 15.0000 mg | ORAL_TABLET | Freq: Every day | ORAL | 1 refills | Status: AC

## 2016-07-29 NOTE — Progress Notes (Signed)
   Subjective:  Patient presents today for a new complaint of pain to the right heel. 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 She has noticed this pain for several weeks now with no alleviating factors of rest.    Objective/Physical Exam General: The patient is alert and oriented x3 in no acute distress.  Dermatology: Skin is warm, dry and supple bilateral lower extremities. Negative for open lesions or macerations.  Vascular: Palpable pedal pulses bilaterally. No edema or erythema noted. Capillary refill within normal limits.  Neurological: Epicritic and protective threshold grossly intact bilaterally.   Musculoskeletal Exam: Pain on palpation noted to the posterior aspect of the right calcaneus. There is a large prominent the posterior heel consistent with a Haglund's deformity right lower extremity. Range of motion within normal limits to all pedal and ankle joints bilateral. Muscle strength 5/5 in all groups bilateral.   Radiographic Exam:  Radiographic evidence of calcification at the insertion of the Achilles tendon with a posterior heel spur right lower extremity on lateral view.  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 #2 injection of 0.5 mL Celestone Soluspan injected in the patient's retrocalcaneal bursa right lower extremity. Care was taken not to directly injected into the Achilles tendon. #3 prescription for meloxicam 15 mg #4 recommend stretching exercises #5 return to clinic in 4 weeks  Felecia ShellingBrent M. Evans, DPM [facility]  Dr. Felecia ShellingBrent M. Evans, DPM    [address]                                        [place], [place] [address]                Office [phone]  Fax [phone]  [phone]

## 2016-08-11 DIAGNOSIS — N6489 Other specified disorders of breast: Secondary | ICD-10-CM | POA: Diagnosis not present

## 2016-08-11 DIAGNOSIS — R229 Localized swelling, mass and lump, unspecified: Secondary | ICD-10-CM | POA: Diagnosis not present

## 2016-08-11 DIAGNOSIS — N760 Acute vaginitis: Secondary | ICD-10-CM | POA: Diagnosis not present

## 2016-08-15 ENCOUNTER — Ambulatory Visit: Payer: 59 | Admitting: Podiatry

## 2016-08-28 ENCOUNTER — Emergency Department (HOSPITAL_BASED_OUTPATIENT_CLINIC_OR_DEPARTMENT_OTHER)
Admission: EM | Admit: 2016-08-28 | Discharge: 2016-08-28 | Disposition: A | Discharge status: Home / Self Care | Payer: 59 | Attending: Emergency Medicine | Admitting: Emergency Medicine

## 2016-08-28 ENCOUNTER — Encounter (HOSPITAL_BASED_OUTPATIENT_CLINIC_OR_DEPARTMENT_OTHER): Payer: Self-pay

## 2016-08-28 DIAGNOSIS — R05 Cough: Secondary | ICD-10-CM | POA: Diagnosis not present

## 2016-08-28 DIAGNOSIS — J111 Influenza due to unidentified influenza virus with other respiratory manifestations: Secondary | ICD-10-CM

## 2016-08-28 DIAGNOSIS — F17210 Nicotine dependence, cigarettes, uncomplicated: Secondary | ICD-10-CM | POA: Diagnosis not present

## 2016-08-28 DIAGNOSIS — R509 Fever, unspecified: Secondary | ICD-10-CM | POA: Diagnosis not present

## 2016-08-28 DIAGNOSIS — R69 Illness, unspecified: Secondary | ICD-10-CM

## 2016-08-28 MED ORDER — GUAIFENESIN-CODEINE 100-10 MG/5ML PO SOLN: 10.0000 mL | Freq: Four times a day (QID) | ORAL | 0 refills | Status: DC | PRN

## 2016-08-28 NOTE — ED Triage Notes (Signed)
c/o flu like sx started yesterday-NAD-steady gait-tylenol 1 hour PTA-NAD-steady gait

## 2016-08-28 NOTE — ED Provider Notes (Signed)
[facility] Provider Note   CSN: [id number] Arrival date & time: 08/28/16  1841   By signing my name below, I, Soijett Blue, attest that this documentation has been prepared under the direction and in the presence of Geoffery Lyons, MD. Electronically Signed: Soijett Blue, ED Scribe. 08/28/16. 7:11 PM.  History   Chief Complaint Chief Complaint  Patient presents with  . Cough    HPI Rhonda Goodman is a 46 y.o. female who presents to the Emergency Department complaining of dry cough onset yesterday. Pt reports associated fever (TMAX 102.5) and chills. Pt has tried delsum and tylenol cold and flu with her last dose being 1 hour ago PTA with mild relief of her symptoms. 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 She reports that she did obtain the flu vaccination. Pt states that she completed a course of flagyl for BV with her last dose being yesterday. She denies neck pain, abdominal pain, leg swelling, and any other symptoms. She reports that she is otherwise healthy, but that she smokes cigarettes.      The history is provided by the patient. No language interpreter was used.    History reviewed. No pertinent past medical history.  Patient Active Problem List   Diagnosis Date Noted  . Heel spur 08/23/2015  . Plantar fasciitis 08/23/2015  . 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 Bony exostosis 08/23/2015    Past Surgical History:  Procedure Laterality Date  . CESAREAN SECTION      OB History    No data available       Home Medications    Prior to Admission medications   Medication Sig Start Date End Date Taking? Authorizing Provider  meloxicam (MOBIC) 15 MG tablet Take 1 tablet (15 mg total) by mouth daily. 07/29/16 08/28/16  Felecia Shelling, DPM  Multiple Vitamins-Minerals (ALIVE ONCE DAILY WOMENS 50+ PO) Take 1 capsule by mouth daily.    Historical Provider, MD    Family History Family History  Problem Relation Age of  Onset  . Leukemia Mother   . Hypertension Father     Social History Social History  Substance Use Topics  . Smoking status: Current Some Day Smoker    Types: Cigarettes  . Smokeless tobacco: Never Used  . Alcohol use Yes     Comment: occ     Allergies   Patient has no known allergies.   Review of Systems Review of Systems  All other systems reviewed and are negative.    Physical Exam Updated Vital Signs BP 125/77 (BP Location: Left Arm)   Pulse (!) 102   Temp 100.1 F (37.8 C) (Oral)   Resp 19   Ht 5\' 1"  (1.549 m)   Wt 175 lb (79.4 kg)   LMP 08/09/2016   SpO2 100%   BMI 33.07 kg/m   Physical Exam  Constitutional: She is oriented to person, place, and time. She appears well-developed and well-nourished. No distress.  HENT:  Head: Normocephalic and atraumatic.  Right Ear: Hearing, tympanic membrane, external ear and ear canal normal.  Left Ear: Hearing, tympanic membrane, external ear and ear canal normal.  Nose: Nose normal.  Mouth/Throat: Uvula is midline, oropharynx is clear and moist and mucous membranes are normal.  Eyes: Conjunctivae and EOM are normal. Pupils are equal, round, and reactive to light.  Neck: Normal range of motion. Neck supple.  Cardiovascular: Normal rate, regular rhythm, S1 normal, S2 normal and normal heart sounds.  Exam reveals no gallop and no friction rub.   No murmur heard. Pulmonary/Chest: Effort normal and breath sounds normal. No respiratory distress. She exhibits no tenderness.  Abdominal: Soft. Normal appearance and bowel sounds are normal. There is no hepatosplenomegaly. There is no tenderness. There is no rebound, no guarding, no tenderness at McBurney's point and negative Murphy's sign. No hernia.  Musculoskeletal: Normal range of motion.  Neurological: She is alert and oriented to person, place, and time. She has normal strength. Coordination normal.  Skin: Skin is warm, dry and intact. No rash noted. No cyanosis.    Psychiatric: She has a normal mood and affect. Her speech is normal and behavior is normal. Thought content normal.  Nursing note and vitals reviewed.    ED Treatments / Results  DIAGNOSTIC STUDIES: Oxygen Saturation is 100% on RA, nl by my interpretation.    COORDINATION OF CARE: 7:30 PM Discussed treatment plan with pt at bedside which includes robitussin with codeine Rx and alternate tylenol and motrin and pt agreed to plan.   Procedures Procedures (including critical care time)  Medications Ordered in ED Medications - No data to display   Initial Impression / Assessment and Plan / ED Course  I have reviewed the triage vital signs and the nursing notes.  Symptoms most likely viral in nature. We'll treat with over-the-counter medications, Robitussin with codeine, and when necessary return if she worsens.  Final Clinical Impressions(s) / ED Diagnoses   Final diagnoses:  None    New Prescriptions New Prescriptions   No medications on file   I personally performed the services described in this documentation, which was scribed in my presence. The recorded information has been reviewed and is accurate.        Geoffery Lyonsouglas Norvella Loscalzo, MD 08/28/16 2251

## 2016-08-28 NOTE — Discharge Instructions (Signed)
Tylenol 1000 mg rotated with Motrin 600 mg every 4 hours as needed for fever.  Robitussin with codeine as prescribed as needed for cough.  Return to the emergency department if you develop chest pain, difficulty breathing, or other new and concerning symptoms.

## 2016-08-30 ENCOUNTER — Encounter (HOSPITAL_COMMUNITY): Payer: Self-pay

## 2016-08-30 ENCOUNTER — Emergency Department (HOSPITAL_COMMUNITY): Payer: 59

## 2016-08-30 ENCOUNTER — Emergency Department (HOSPITAL_COMMUNITY)
Admission: EM | Admit: 2016-08-30 | Discharge: 2016-08-30 | Disposition: A | Discharge status: Home / Self Care | Payer: 59 | Attending: Emergency Medicine | Admitting: Emergency Medicine

## 2016-08-30 DIAGNOSIS — K5900 Constipation, unspecified: Secondary | ICD-10-CM | POA: Diagnosis not present

## 2016-08-30 DIAGNOSIS — R05 Cough: Secondary | ICD-10-CM | POA: Diagnosis not present

## 2016-08-30 DIAGNOSIS — M545 Low back pain, unspecified: Secondary | ICD-10-CM

## 2016-08-30 DIAGNOSIS — R109 Unspecified abdominal pain: Secondary | ICD-10-CM | POA: Diagnosis present

## 2016-08-30 DIAGNOSIS — N281 Cyst of kidney, acquired: Secondary | ICD-10-CM | POA: Insufficient documentation

## 2016-08-30 LAB — CBC WITH DIFFERENTIAL/PLATELET
Basophils Absolute: 0 10*3/uL (ref 0.0–0.1)
Basophils Relative: 0 %
EOS PCT: 2 %
Eosinophils Absolute: 0.1 10*3/uL (ref 0.0–0.7)
HEMATOCRIT: 40 % (ref 36.0–46.0)
Hemoglobin: 13.4 g/dL (ref 12.0–15.0)
LYMPHS ABS: 1.8 10*3/uL (ref 0.7–4.0)
LYMPHS PCT: 37 %
MCH: 30.9 pg (ref 26.0–34.0)
MCHC: 33.5 g/dL (ref 30.0–36.0)
MCV: 92.4 fL (ref 78.0–100.0)
MONO ABS: 0.5 10*3/uL (ref 0.1–1.0)
MONOS PCT: 10 %
Neutro Abs: 2.5 10*3/uL (ref 1.7–7.7)
Neutrophils Relative %: 51 %
Platelets: 232 10*3/uL (ref 150–400)
RBC: 4.33 MIL/uL (ref 3.87–5.11)
RDW: 13.2 % (ref 11.5–15.5)
WBC: 4.8 10*3/uL (ref 4.0–10.5)

## 2016-08-30 LAB — URINALYSIS, ROUTINE W REFLEX MICROSCOPIC
Bilirubin Urine: NEGATIVE
GLUCOSE, UA: NEGATIVE mg/dL
Hgb urine dipstick: NEGATIVE
KETONES UR: 5 mg/dL — AB
Leukocytes, UA: NEGATIVE
Nitrite: NEGATIVE
PH: 5 (ref 5.0–8.0)
Protein, ur: NEGATIVE mg/dL
Specific Gravity, Urine: 1.027 (ref 1.005–1.030)

## 2016-08-30 LAB — COMPREHENSIVE METABOLIC PANEL
ALBUMIN: 3.7 g/dL (ref 3.5–5.0)
ALK PHOS: 49 U/L (ref 38–126)
ALT: 21 U/L (ref 14–54)
AST: 27 U/L (ref 15–41)
Anion gap: 7 (ref 5–15)
BILIRUBIN TOTAL: 0.4 mg/dL (ref 0.3–1.2)
BUN: 9 mg/dL (ref 6–20)
CALCIUM: 8.2 mg/dL — AB (ref 8.9–10.3)
CO2: 22 mmol/L (ref 22–32)
CREATININE: 0.62 mg/dL (ref 0.44–1.00)
Chloride: 108 mmol/L (ref 101–111)
GFR calc Af Amer: >60 mL/min (ref >60)
GFR calc non Af Amer: >60 mL/min (ref >60)
GLUCOSE: 148 mg/dL — AB (ref 65–99)
Potassium: 3.4 mmol/L — ABNORMAL LOW (ref 3.5–5.1)
Sodium: 137 mmol/L (ref 135–145)
TOTAL PROTEIN: 6.6 g/dL (ref 6.5–8.1)

## 2016-08-30 LAB — INFLUENZA PANEL BY PCR (TYPE A & B)
Influenza A By PCR: POSITIVE — AB
Influenza B By PCR: NEGATIVE

## 2016-08-30 LAB — LIPASE, BLOOD: Lipase: 18 U/L (ref 11–51)

## 2016-08-30 MED ORDER — IOPAMIDOL (ISOVUE-300) INJECTION 61%
INTRAVENOUS | Status: AC
  Administered 2016-08-30: 100 mL via INTRAVENOUS
  Filled 2016-08-30: qty 100

## 2016-08-30 MED ORDER — DICYCLOMINE HCL 10 MG PO CAPS
10.0000 mg | ORAL_CAPSULE | Freq: Once | ORAL | Status: AC
  Administered 2016-08-30: 10 mg via ORAL
  Filled 2016-08-30: qty 1

## 2016-08-30 MED ORDER — SODIUM CHLORIDE 0.9 % IV BOLUS (SEPSIS)
1000.0000 mL | Freq: Once | INTRAVENOUS | Status: AC
  Administered 2016-08-30: 1000 mL via INTRAVENOUS

## 2016-08-30 MED ORDER — KETOROLAC TROMETHAMINE 30 MG/ML IJ SOLN
30.0000 mg | Freq: Once | INTRAMUSCULAR | Status: AC
  Administered 2016-08-30: 30 mg via INTRAVENOUS
  Filled 2016-08-30: qty 1

## 2016-08-30 MED ORDER — DICYCLOMINE HCL 20 MG PO TABS: 20.0000 mg | ORAL_TABLET | Freq: Two times a day (BID) | ORAL | 0 refills | Status: DC | PRN

## 2016-08-30 MED ORDER — DOCUSATE SODIUM 100 MG PO CAPS: 100.0000 mg | ORAL_CAPSULE | Freq: Two times a day (BID) | ORAL | 0 refills | Status: DC | PRN

## 2016-08-30 MED ORDER — POLYETHYLENE GLYCOL 3350 17 G PO PACK: 17.0000 g | PACK | Freq: Every day | ORAL | 0 refills | Status: DC | PRN

## 2016-08-30 NOTE — ED Triage Notes (Signed)
She states she was seen two days ago for fever/aches at Urgent care. Here today with c/o bilat. Flank pain.

## 2016-08-30 NOTE — ED Notes (Signed)
Patient transported to X-ray 

## 2016-08-30 NOTE — ED Notes (Signed)
Patient transported to CT 

## 2016-08-30 NOTE — ED Provider Notes (Signed)
[facility]-EMERGENCY DEPT Provider Note   CSN: [id number] Arrival date & time: 08/30/16  1112     History   Chief Complaint Chief Complaint  Patient presents with  . Flank Pain    HPI Rhonda Goodman is a 46 y.o. female.  HPI Patient presents with back pain that started yesterday evening and progressed throughout the night. No radiations down the legs. No hematuria, dysuria, frequency or hesitancy. Pain is worse with movement and deep breathing. It appears worse on the right than the left side. Nausea but no vomiting or diarrhea. Patient has had intermittent fever. Was seen in the emergency department several days ago and diagnosed with viral illness. She's been taking Tylenol for the symptoms. She has mild cough without sputum production. No past medical history on file.  Patient Active Problem List   Diagnosis Date Noted  . Heel spur 08/23/2015  . Plantar fasciitis 08/23/2015  . 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 Bony exostosis 08/23/2015    Past Surgical History:  Procedure Laterality Date  . CESAREAN SECTION      OB History    No data available       Home Medications    Prior to Admission medications   Medication Sig Start Date End Date Taking? Authorizing Provider  guaiFENesin-codeine 100-10 MG/5ML syrup Take 10 mLs by mouth every 6 (six) hours as needed. 08/28/16  Yes Geoffery Lyons, MD  dicyclomine (BENTYL) 20 MG tablet Take 1 tablet (20 mg total) by mouth 2 (two) times daily as needed for spasms. 08/30/16   Loren Racer, MD  docusate sodium (COLACE) 100 MG capsule Take 1 capsule (100 mg total) by mouth 2 (two) times daily as needed for mild constipation or moderate constipation. 08/30/16   Loren Racer, MD  Multiple Vitamins-Minerals (ALIVE ONCE DAILY WOMENS 50+ PO) Take 1 capsule by mouth daily.    Historical Provider, MD  polyethylene glycol (MIRALAX / GLYCOLAX) packet Take 17 g by mouth daily as needed for moderate constipation or severe constipation.  08/30/16   Loren Racer, MD    Family History Family History  Problem Relation Age of Onset  . Leukemia Mother   . Hypertension Father     Social History Social History  Substance Use Topics  . Smoking status: Current Some Day Smoker    Types: Cigarettes  . Smokeless tobacco: Never Used  . Alcohol use Yes     Comment: occ     Allergies   Patient has no known allergies.   Review of Systems Review of Systems  Constitutional: Positive for chills, fatigue and fever.  HENT: Negative for congestion, sinus pressure and sore throat.   Eyes: Negative for visual disturbance.  Respiratory: Positive for cough. Negative for shortness of breath.   Cardiovascular: Negative for chest pain, palpitations and leg swelling.  Gastrointestinal: Positive for nausea. Negative for abdominal pain, constipation, diarrhea and vomiting.  Genitourinary: Positive for flank pain. Negative for difficulty urinating, dysuria, frequency, hematuria, menstrual problem and pelvic pain.  Musculoskeletal: Positive for back pain and myalgias. Negative for neck pain and neck stiffness.  Skin: Negative for rash and wound.  Neurological: Negative for dizziness, light-headedness, numbness and headaches.  All other systems reviewed and are negative.    Physical Exam Updated Vital Signs BP 99/66 (BP Location: Right Arm)   Pulse 76   Temp 98 F (36.7 C) (Oral)   Resp 15   Ht  (1.549 m)   Wt 175 lb (79.4 kg)   LMP  08/09/2016 (Exact Date)   SpO2 98%   BMI 33.07 kg/m   Physical Exam  Constitutional: She is oriented to person, place, and time. She appears well-developed and well-nourished.  HENT:  Head: Normocephalic and atraumatic.  Mouth/Throat: Oropharynx is clear and moist.  Eyes: EOM are normal. Pupils are equal, round, and reactive to light.  Neck: Normal range of motion. Neck supple.  Cardiovascular: Normal rate and regular rhythm.  Exam reveals no gallop and no friction rub.   No murmur  heard. Pulmonary/Chest: Effort normal and breath sounds normal. No respiratory distress. She has no wheezes. She has no rales.  Abdominal: Soft. Bowel sounds are normal. There is tenderness (mild tenderness to deep palpation especially in the right upper quadrant.). There is no rebound and no guarding. No hernia.  Musculoskeletal: Normal range of motion. She exhibits tenderness. She exhibits no edema.  Right CVA tenderness with percussion. No midline thoracic or lumbar tenderness. No lower extremity swelling or asymmetry.  Neurological: She is alert and oriented to person, place, and time.  Moving all extremities without deficit. Sensation fully intact.  Skin: Skin is warm and dry. Capillary refill takes less than 2 seconds. No rash noted. No erythema.  Psychiatric: She has a normal mood and affect. Her behavior is normal.  Nursing note and vitals reviewed.    ED Treatments / Results  Labs (all labs ordered are listed, but only abnormal results are displayed) Labs Reviewed  COMPREHENSIVE METABOLIC PANEL - Abnormal; Notable for the following:       Result Value   Potassium 3.4 (*)    Glucose, Bld 148 (*)    Calcium 8.2 (*)    All other components within normal limits  URINALYSIS, ROUTINE W REFLEX MICROSCOPIC - Abnormal; Notable for the following:    APPearance HAZY (*)    Ketones, ur 5 (*)    All other components within normal limits  CBC WITH DIFFERENTIAL/PLATELET  LIPASE, BLOOD  INFLUENZA PANEL BY PCR (TYPE A & B)    EKG  EKG Interpretation None       Radiology Dg Chest 2 View  Result Date: 08/30/2016 CLINICAL DATA:  Pt c/o bil chest, dorsum, and flank pain x 24 hrs w/ intermittent fever, pt reports recently going to UC for "flu symptoms," c/o diffuse body aches and cough as descriptors of original illness. EXAM: CHEST  2 VIEW COMPARISON:  02/05/2016 FINDINGS: The heart size and mediastinal contours are within normal limits. Both lungs are clear. No pleural effusion or  pneumothorax. The visualized skeletal structures are unremarkable. IMPRESSION: Normal chest radiographs. Electronically Signed   By: Amie Portland M.D.   On: 08/30/2016 12:26   Ct Abdomen Pelvis W Contrast  Result Date: 08/30/2016 CLINICAL DATA:  Bilateral flank and lower pelvic pain, worse on the right. EXAM: CT ABDOMEN AND PELVIS WITH CONTRAST TECHNIQUE: Multidetector CT imaging of the abdomen and pelvis was performed using the standard protocol following bolus administration of intravenous contrast. CONTRAST:  100 mL Isovue-300 COMPARISON:  None. FINDINGS: Lower chest: Focal volume loss along the medial base of the right middle lobe. No pleural effusions. Hepatobiliary: Normal appearance of the liver, gallbladder and portal venous system. Pancreas: Normal appearance of the pancreas without inflammation or duct dilatation. Spleen: Normal appearance of spleen without enlargement. Small accessory spleen. Adrenals/Urinary Tract: Normal adrenal glands. 1.4 cm hypodensity in the right kidney lower pole is suggestive for a cyst. There may be an additional small cyst in the medial right kidney which is too  small to definitively characterize. No hydronephrosis. No definite kidney stones. Urinary bladder is unremarkable. Calcification along the posterior left side of the bladder probably represents a phlebolith. Stomach/Bowel: Appendix is normal. No evidence for bowel obstruction or focal bowel inflammation. Vascular/Lymphatic: No significant vascular findings are present. No enlarged abdominal or pelvic lymph nodes. Reproductive: Uterus and bilateral adnexa are unremarkable. Other: Trace fluid in the cul-de-sac is probably physiologic. Musculoskeletal: No acute abnormality. IMPRESSION: No acute abnormality in the abdomen or pelvis. Trace pelvic fluid is likely physiologic. Small amount of volume loss in the right middle lobe. Small right renal cysts. Electronically Signed   By: Richarda Overlie M.D.   On: 08/30/2016 13:56      Procedures Procedures (including critical care time)  Medications Ordered in ED Medications  dicyclomine (BENTYL) capsule 10 mg (not administered)  sodium chloride 0.9 % bolus 1,000 mL (0 mLs Intravenous Stopped 08/30/16 1402)  ketorolac (TORADOL) 30 MG/ML injection 30 mg (30 mg Intravenous Given 08/30/16 1248)  iopamidol (ISOVUE-300) 61 % injection (100 mLs Intravenous Contrast Given 08/30/16 1331)     Initial Impression / Assessment and Plan / ED Course  I have reviewed the triage vital signs and the nursing notes.  Pertinent labs & imaging results that were available during my care of the patient were reviewed by me and considered in my medical decision making (see chart for details).     Patient is very well-appearing. Laboratory workup is normal. CT abdomen with right renal cyst. Also appears to have moderate stool throughout the colon. Patient admits to having intermittent constipation. Admitted that pain improved yesterday evening after passing flatus. This may be contributing to her symptoms. Will treat for constipation. If she has persistent symptoms advised to follow-up with nephrology. Strict return precautions been given.  Final Clinical Impressions(s) / ED Diagnoses   Final diagnoses:  Acute bilateral low back pain without sciatica  Constipation, unspecified constipation type  Renal cyst, right    New Prescriptions New Prescriptions   DICYCLOMINE (BENTYL) 20 MG TABLET    Take 1 tablet (20 mg total) by mouth 2 (two) times daily as needed for spasms.   DOCUSATE SODIUM (COLACE) 100 MG CAPSULE    Take 1 capsule (100 mg total) by mouth 2 (two) times daily as needed for mild constipation or moderate constipation.   POLYETHYLENE GLYCOL (MIRALAX / GLYCOLAX) PACKET    Take 17 g by mouth daily as needed for moderate constipation or severe constipation.     Loren Racer, MD 08/30/16 1452

## 2016-09-01 DIAGNOSIS — M4216 Adult osteochondrosis of spine, lumbar region: Secondary | ICD-10-CM | POA: Diagnosis not present

## 2016-09-01 DIAGNOSIS — J09X1 Influenza due to identified novel influenza A virus with pneumonia: Secondary | ICD-10-CM | POA: Diagnosis not present

## 2016-09-09 DIAGNOSIS — M25562 Pain in left knee: Secondary | ICD-10-CM | POA: Diagnosis not present

## 2016-09-09 DIAGNOSIS — Z01419 Encounter for gynecological examination (general) (routine) without abnormal findings: Secondary | ICD-10-CM | POA: Diagnosis not present

## 2016-09-09 DIAGNOSIS — M25561 Pain in right knee: Secondary | ICD-10-CM | POA: Diagnosis not present

## 2016-09-09 DIAGNOSIS — Z1231 Encounter for screening mammogram for malignant neoplasm of breast: Secondary | ICD-10-CM | POA: Diagnosis not present

## 2016-09-09 DIAGNOSIS — Z6834 Body mass index (BMI) 34.0-34.9, adult: Secondary | ICD-10-CM | POA: Diagnosis not present

## 2016-09-09 DIAGNOSIS — M170 Bilateral primary osteoarthritis of knee: Secondary | ICD-10-CM | POA: Diagnosis not present

## 2016-09-16 DIAGNOSIS — M461 Sacroiliitis, not elsewhere classified: Secondary | ICD-10-CM | POA: Diagnosis not present

## 2016-09-16 DIAGNOSIS — M130 Polyarthritis, unspecified: Secondary | ICD-10-CM | POA: Diagnosis not present

## 2016-09-17 DIAGNOSIS — M25561 Pain in right knee: Secondary | ICD-10-CM | POA: Diagnosis not present

## 2016-09-17 DIAGNOSIS — M1711 Unilateral primary osteoarthritis, right knee: Secondary | ICD-10-CM | POA: Diagnosis not present

## 2016-09-18 DIAGNOSIS — M533 Sacrococcygeal disorders, not elsewhere classified: Secondary | ICD-10-CM | POA: Diagnosis not present

## 2016-09-25 DIAGNOSIS — M25561 Pain in right knee: Secondary | ICD-10-CM | POA: Diagnosis not present

## 2016-09-25 DIAGNOSIS — M533 Sacrococcygeal disorders, not elsewhere classified: Secondary | ICD-10-CM | POA: Diagnosis not present

## 2016-09-25 DIAGNOSIS — M1711 Unilateral primary osteoarthritis, right knee: Secondary | ICD-10-CM | POA: Diagnosis not present

## 2016-09-30 DIAGNOSIS — M533 Sacrococcygeal disorders, not elsewhere classified: Secondary | ICD-10-CM | POA: Diagnosis not present

## 2016-10-06 DIAGNOSIS — M25561 Pain in right knee: Secondary | ICD-10-CM | POA: Diagnosis not present

## 2016-10-06 DIAGNOSIS — M1711 Unilateral primary osteoarthritis, right knee: Secondary | ICD-10-CM | POA: Diagnosis not present

## 2016-10-16 DIAGNOSIS — M461 Sacroiliitis, not elsewhere classified: Secondary | ICD-10-CM | POA: Diagnosis not present

## 2016-10-16 DIAGNOSIS — M4216 Adult osteochondrosis of spine, lumbar region: Secondary | ICD-10-CM | POA: Diagnosis not present

## 2016-10-16 DIAGNOSIS — M130 Polyarthritis, unspecified: Secondary | ICD-10-CM | POA: Diagnosis not present

## 2016-11-27 DIAGNOSIS — M4216 Adult osteochondrosis of spine, lumbar region: Secondary | ICD-10-CM | POA: Diagnosis not present

## 2016-11-27 DIAGNOSIS — M461 Sacroiliitis, not elsewhere classified: Secondary | ICD-10-CM | POA: Diagnosis not present

## 2017-02-11 DIAGNOSIS — M549 Dorsalgia, unspecified: Secondary | ICD-10-CM | POA: Diagnosis not present

## 2017-02-11 DIAGNOSIS — M130 Polyarthritis, unspecified: Secondary | ICD-10-CM | POA: Diagnosis not present

## 2017-03-10 ENCOUNTER — Encounter: Payer: Self-pay | Admitting: Neurology

## 2017-03-10 ENCOUNTER — Ambulatory Visit (INDEPENDENT_AMBULATORY_CARE_PROVIDER_SITE_OTHER): Payer: Self-pay | Admitting: Neurology

## 2017-03-10 ENCOUNTER — Ambulatory Visit (INDEPENDENT_AMBULATORY_CARE_PROVIDER_SITE_OTHER): Payer: 59 | Admitting: Neurology

## 2017-03-10 DIAGNOSIS — G5601 Carpal tunnel syndrome, right upper limb: Secondary | ICD-10-CM | POA: Diagnosis not present

## 2017-03-10 HISTORY — DX: Carpal tunnel syndrome, right upper limb: G56.01

## 2017-03-10 NOTE — Progress Notes (Signed)
Please refer to EMG and nerve conduction study procedure note. 

## 2017-03-10 NOTE — Procedures (Signed)
     HISTORY:  Rhonda Goodman is a 46 year old patient with a history of numbness in both hands that developed over the last 4-6 weeks. The symptoms are more prominent the right hand than in the left, the patient notes some neck stiffness without overt pain. She oftentimes wakes up in night with numbness in the hands. She is being evaluated for a possible neuropathy or a cervical radiculopathy.  NERVE CONDUCTION STUDIES:  Nerve conduction studies were performed on both upper extremities. The distal motor latencies for the median nerves were prolonged on the right and normal on the left with normal motor amplitudes for these nerves bilaterally. The distal motor latencies and motor amplitudes for the ulnar nerves were normal bilaterally. The nerve conduction velocities for the median and ulnar nerves were normal. The sensory latencies for the median nerves were prolonged on the right, normal on the left, and normal for the ulnar nerves bilaterally.  EMG STUDIES:  EMG study was performed on the left upper extremity:  The first dorsal interosseous muscle reveals 2 to 4 K units with full recruitment. No fibrillations or positive waves were noted. The abductor pollicis brevis muscle reveals 2 to 4 K units with full recruitment. No fibrillations or positive waves were noted. The extensor indicis proprius muscle reveals 1 to 3 K units with full recruitment. No fibrillations or positive waves were noted. The pronator teres muscle reveals 2 to 3 K units with full recruitment. No fibrillations or positive waves were noted. The biceps muscle reveals 1 to 2 K units with full recruitment. No fibrillations or positive waves were noted. The triceps muscle reveals 2 to 4 K units with full recruitment. No fibrillations or positive waves were noted. The anterior deltoid muscle reveals 2 to 3 K units with full recruitment. No fibrillations or positive waves were noted. The cervical paraspinal muscles were  tested at 2 levels. No abnormalities of insertional activity were seen at either level tested. There was good relaxation.  A limited EMG study was performed on the right upper extremity:  The first dorsal interosseous muscle reveals 2 to 4 K units with full recruitment. No fibrillations or positive waves were noted. The abductor pollicis brevis muscle reveals 2 to 4 K units with full recruitment. No fibrillations or positive waves were noted. The extensor indicis proprius muscle reveals 1 to 3 K units with full recruitment. No fibrillations or positive waves were noted. The pronator teres muscle reveals 2 to 3 K units with full recruitment. No fibrillations or positive waves were noted.    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 No other significant abnormalities were seen. EMG evaluation of the left upper extremity was unremarkable without evidence of an overlying cervical radiculopathy. A limited EMG on the right upper extremity was unremarkable.   Marlan Palau MD 03/10/2017 1:40 PM  [facility] [address] [place], [place] [address]  Phone [phone] Fax [phone]

## 2017-03-12 NOTE — Progress Notes (Signed)
    [facility]    Nerve / Sites Muscle Latency Ref. Amplitude Ref. Rel Amp Segments Distance Velocity Ref. Area    ms ms mV mV %  cm m/s m/s mVms  L Median - APB     Wrist APB 3.0 ?4.4 13.6 ?4.0 100 Wrist - APB 7   41.1     Upper arm APB 6.9  12.8  93.8 Upper arm - Wrist 25 65 ?49 40.5  R Median - APB     Wrist APB 4.5 ?4.4 16.5 ?4.0 100 Wrist - APB 7   49.5     Upper arm APB 8.0  15.9  96.3 Upper arm - Wrist 23 66 ?49 49.4  L Ulnar - ADM     Wrist ADM 2.2 ?3.3 9.5 ?6.0 100 Wrist - ADM 7   28.9     B.Elbow ADM 5.8  10.0  105 B.Elbow - Wrist 21 58 ?49 29.9     A.Elbow ADM 7.3  9.7  96.8 A.Elbow - B.Elbow 10 66 ?49 29.5         A.Elbow - Wrist      R Ulnar - ADM     Wrist ADM 2.5 ?3.3 8.6 ?6.0 100 Wrist - ADM 7   23.9     B.Elbow ADM 6.0  7.8  90.8 B.Elbow - Wrist 21 60 ?49 23.7     A.Elbow ADM 7.6  7.5  96 A.Elbow - B.Elbow 10 62 ?49 23.2         A.Elbow - Wrist                 [facility]    Nerve / Sites Rec. Site Peak Lat Amp Segments Distance    ms V  cm  L Median - Orthodromic (Dig II, Mid palm)     Dig II Wrist 3.2 27 Dig II - Wrist 13  R Median - Orthodromic (Dig II, Mid palm)     Dig II Wrist 4.2 21 Dig II - Wrist 13  L Ulnar - Orthodromic, (Dig V, Mid palm)     Dig V Wrist 2.6 33 Dig V - Wrist 11  R Ulnar - Orthodromic, (Dig V, Mid palm)     Dig V Wrist 2.9 43 Dig V - Wrist 11             F  Wave    Nerve F Lat Ref.   ms ms  L Median - APB 23.8 ?31.0  L Ulnar - ADM 24.1 ?32.0  R Median - APB 25.1 ?31.0  R Ulnar - ADM 24.7 ?32.0             EMG full       EMG Summary Table    Spontaneous MUAP Recruitment  Muscle IA Fib PSW Fasc Other Amp Dur. Poly Pattern  R. Abductor digiti minimi (manus) Normal None None None _______ Normal Normal Normal Normal

## 2017-04-08 DIAGNOSIS — M1711 Unilateral primary osteoarthritis, right knee: Secondary | ICD-10-CM | POA: Diagnosis not present

## 2017-04-08 DIAGNOSIS — M25561 Pain in right knee: Secondary | ICD-10-CM | POA: Diagnosis not present

## 2017-04-30 DIAGNOSIS — G5602 Carpal tunnel syndrome, left upper limb: Secondary | ICD-10-CM | POA: Diagnosis not present

## 2017-04-30 DIAGNOSIS — M130 Polyarthritis, unspecified: Secondary | ICD-10-CM | POA: Diagnosis not present

## 2017-04-30 DIAGNOSIS — S332XXA Dislocation of sacroiliac and sacrococcygeal joint, initial encounter: Secondary | ICD-10-CM | POA: Diagnosis not present

## 2017-05-06 DIAGNOSIS — G5603 Carpal tunnel syndrome, bilateral upper limbs: Secondary | ICD-10-CM | POA: Diagnosis not present

## 2017-05-06 DIAGNOSIS — M1811 Unilateral primary osteoarthritis of first carpometacarpal joint, right hand: Secondary | ICD-10-CM | POA: Diagnosis not present

## 2017-05-06 DIAGNOSIS — M542 Cervicalgia: Secondary | ICD-10-CM | POA: Diagnosis not present

## 2017-05-18 ENCOUNTER — Other Ambulatory Visit: Payer: Self-pay | Admitting: Orthopedic Surgery

## 2017-05-18 DIAGNOSIS — M25552 Pain in left hip: Secondary | ICD-10-CM | POA: Diagnosis not present

## 2017-05-18 DIAGNOSIS — G8929 Other chronic pain: Secondary | ICD-10-CM

## 2017-05-18 DIAGNOSIS — M545 Low back pain: Principal | ICD-10-CM

## 2017-05-18 DIAGNOSIS — M5136 Other intervertebral disc degeneration, lumbar region: Secondary | ICD-10-CM | POA: Diagnosis not present

## 2017-06-04 ENCOUNTER — Ambulatory Visit
Admission: RE | Admit: 2017-06-04 | Discharge: 2017-06-04 | Disposition: A | Discharge status: Home / Self Care | Payer: 59 | Source: Ambulatory Visit | Attending: Orthopedic Surgery | Admitting: Orthopedic Surgery

## 2017-06-04 DIAGNOSIS — M545 Low back pain: Secondary | ICD-10-CM

## 2017-06-04 DIAGNOSIS — M5126 Other intervertebral disc displacement, lumbar region: Secondary | ICD-10-CM | POA: Insufficient documentation

## 2017-06-04 DIAGNOSIS — M48061 Spinal stenosis, lumbar region without neurogenic claudication: Secondary | ICD-10-CM | POA: Diagnosis not present

## 2017-06-04 DIAGNOSIS — M4807 Spinal stenosis, lumbosacral region: Secondary | ICD-10-CM | POA: Diagnosis not present

## 2017-06-04 DIAGNOSIS — M549 Dorsalgia, unspecified: Secondary | ICD-10-CM | POA: Insufficient documentation

## 2017-06-04 DIAGNOSIS — G8929 Other chronic pain: Secondary | ICD-10-CM

## 2017-06-04 DIAGNOSIS — M47816 Spondylosis without myelopathy or radiculopathy, lumbar region: Secondary | ICD-10-CM | POA: Insufficient documentation

## 2017-06-04 DIAGNOSIS — M79605 Pain in left leg: Secondary | ICD-10-CM | POA: Insufficient documentation

## 2017-06-09 DIAGNOSIS — N760 Acute vaginitis: Secondary | ICD-10-CM | POA: Diagnosis not present

## 2017-06-23 DIAGNOSIS — G8929 Other chronic pain: Secondary | ICD-10-CM | POA: Diagnosis not present

## 2017-06-23 DIAGNOSIS — Z6838 Body mass index (BMI) 38.0-38.9, adult: Secondary | ICD-10-CM | POA: Diagnosis not present

## 2017-06-23 DIAGNOSIS — M545 Low back pain: Secondary | ICD-10-CM | POA: Diagnosis not present

## 2017-06-23 DIAGNOSIS — R030 Elevated blood-pressure reading, without diagnosis of hypertension: Secondary | ICD-10-CM | POA: Diagnosis not present

## 2017-06-23 MED FILL — MELOXICAM 15 MG TABLET: 15 | 60 days supply | Qty: 60 | Fill #0

## 2017-07-01 DIAGNOSIS — L2089 Other atopic dermatitis: Secondary | ICD-10-CM | POA: Diagnosis not present

## 2017-08-07 DIAGNOSIS — G5603 Carpal tunnel syndrome, bilateral upper limbs: Secondary | ICD-10-CM | POA: Diagnosis not present

## 2017-08-10 ENCOUNTER — Other Ambulatory Visit: Payer: Self-pay | Admitting: Orthopedic Surgery

## 2017-08-11 DIAGNOSIS — M1711 Unilateral primary osteoarthritis, right knee: Secondary | ICD-10-CM | POA: Diagnosis not present

## 2017-08-11 DIAGNOSIS — M25561 Pain in right knee: Secondary | ICD-10-CM | POA: Diagnosis not present

## 2017-08-20 DIAGNOSIS — Z6838 Body mass index (BMI) 38.0-38.9, adult: Secondary | ICD-10-CM | POA: Diagnosis not present

## 2017-08-20 DIAGNOSIS — M47816 Spondylosis without myelopathy or radiculopathy, lumbar region: Secondary | ICD-10-CM | POA: Diagnosis not present

## 2017-08-20 DIAGNOSIS — M5416 Radiculopathy, lumbar region: Secondary | ICD-10-CM | POA: Diagnosis not present

## 2017-08-20 DIAGNOSIS — R030 Elevated blood-pressure reading, without diagnosis of hypertension: Secondary | ICD-10-CM | POA: Diagnosis not present

## 2017-08-20 DIAGNOSIS — M5126 Other intervertebral disc displacement, lumbar region: Secondary | ICD-10-CM | POA: Diagnosis not present

## 2017-09-01 DIAGNOSIS — N760 Acute vaginitis: Secondary | ICD-10-CM | POA: Diagnosis not present

## 2017-09-01 DIAGNOSIS — Z113 Encounter for screening for infections with a predominantly sexual mode of transmission: Secondary | ICD-10-CM | POA: Diagnosis not present

## 2017-09-02 DIAGNOSIS — M5416 Radiculopathy, lumbar region: Secondary | ICD-10-CM | POA: Diagnosis not present

## 2017-09-02 DIAGNOSIS — M5126 Other intervertebral disc displacement, lumbar region: Secondary | ICD-10-CM | POA: Diagnosis not present

## 2017-10-29 DIAGNOSIS — Z01419 Encounter for gynecological examination (general) (routine) without abnormal findings: Secondary | ICD-10-CM | POA: Diagnosis not present

## 2017-10-29 DIAGNOSIS — R82998 Other abnormal findings in urine: Secondary | ICD-10-CM | POA: Diagnosis not present

## 2017-10-29 DIAGNOSIS — Z1231 Encounter for screening mammogram for malignant neoplasm of breast: Secondary | ICD-10-CM | POA: Diagnosis not present

## 2017-10-29 DIAGNOSIS — Z6832 Body mass index (BMI) 32.0-32.9, adult: Secondary | ICD-10-CM | POA: Diagnosis not present

## 2017-11-12 ENCOUNTER — Ambulatory Visit (HOSPITAL_BASED_OUTPATIENT_CLINIC_OR_DEPARTMENT_OTHER): Admission: RE | Admit: 2017-11-12 | Payer: 59 | Source: Ambulatory Visit | Admitting: Orthopedic Surgery

## 2017-11-12 ENCOUNTER — Encounter (HOSPITAL_BASED_OUTPATIENT_CLINIC_OR_DEPARTMENT_OTHER): Admission: RE | Payer: Self-pay | Source: Ambulatory Visit

## 2017-11-12 SURGERY — CARPAL TUNNEL RELEASE
Anesthesia: Choice | Laterality: Right

## 2017-11-18 DIAGNOSIS — M25561 Pain in right knee: Secondary | ICD-10-CM | POA: Diagnosis not present

## 2017-11-18 DIAGNOSIS — M170 Bilateral primary osteoarthritis of knee: Secondary | ICD-10-CM | POA: Diagnosis not present

## 2017-11-18 DIAGNOSIS — M25562 Pain in left knee: Secondary | ICD-10-CM | POA: Diagnosis not present

## 2017-11-23 DIAGNOSIS — S76012A Strain of muscle, fascia and tendon of left hip, initial encounter: Secondary | ICD-10-CM | POA: Diagnosis not present

## 2017-12-07 DIAGNOSIS — M25562 Pain in left knee: Secondary | ICD-10-CM | POA: Diagnosis not present

## 2017-12-07 DIAGNOSIS — M1712 Unilateral primary osteoarthritis, left knee: Secondary | ICD-10-CM | POA: Diagnosis not present

## 2017-12-16 DIAGNOSIS — M25562 Pain in left knee: Secondary | ICD-10-CM | POA: Diagnosis not present

## 2017-12-16 DIAGNOSIS — M1712 Unilateral primary osteoarthritis, left knee: Secondary | ICD-10-CM | POA: Diagnosis not present

## 2017-12-17 ENCOUNTER — Ambulatory Visit (INDEPENDENT_AMBULATORY_CARE_PROVIDER_SITE_OTHER): Payer: 59 | Admitting: Family Medicine

## 2017-12-17 ENCOUNTER — Encounter: Payer: Self-pay | Admitting: Family Medicine

## 2017-12-17 ENCOUNTER — Other Ambulatory Visit: Payer: Self-pay

## 2017-12-17 VITALS — BP 116/70 | HR 77 | Temp 98.8°F | Resp 17 | Ht 61.5 in | Wt 185.8 lb

## 2017-12-17 DIAGNOSIS — Z0000 Encounter for general adult medical examination without abnormal findings: Secondary | ICD-10-CM

## 2017-12-17 DIAGNOSIS — Z13220 Encounter for screening for lipoid disorders: Secondary | ICD-10-CM

## 2017-12-17 DIAGNOSIS — R7303 Prediabetes: Secondary | ICD-10-CM | POA: Diagnosis not present

## 2017-12-17 NOTE — Progress Notes (Signed)
Chief Complaint  Patient presents with  . Annual Exam    cpe no pap    Subjective:  Rhonda Goodman is a 47 y.o. female here for a health maintenance visit.  Patient is established pt  Patient Active Problem List   Diagnosis Date Noted  . Carpal tunnel syndrome on right 03/10/2017  . Heel spur 08/23/2015  . Plantar fasciitis 08/23/2015  . 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 Bony exostosis 08/23/2015    Past Medical History:  Diagnosis Date  . Carpal tunnel syndrome on right 03/10/2017    Past Surgical History:  Procedure Laterality Date  . CESAREAN SECTION       Outpatient Medications Prior to Visit  Medication Sig Dispense Refill  . Multiple Vitamins-Minerals (ALIVE ONCE DAILY WOMENS 50+ PO) Take 1 capsule by mouth daily.    Marland Kitchen dicyclomine (BENTYL) 20 MG tablet Take 1 tablet (20 mg total) by mouth 2 (two) times daily as needed for spasms. (Patient not taking: Reported on 12/17/2017) 20 tablet 0  . docusate sodium (COLACE) 100 MG capsule Take 1 capsule (100 mg total) by mouth 2 (two) times daily as needed for mild constipation or moderate constipation. (Patient not taking: Reported on 12/17/2017) 30 capsule 0  . guaiFENesin-codeine 100-10 MG/5ML syrup Take 10 mLs by mouth every 6 (six) hours as needed. (Patient not taking: Reported on 12/17/2017) 120 mL 0  . polyethylene glycol (MIRALAX / GLYCOLAX) packet Take 17 g by mouth daily as needed for moderate constipation or severe constipation. (Patient not taking: Reported on 12/17/2017) 14 each 0   Facility-Administered Medications Prior to Visit  Medication Dose Route Frequency Provider Last Rate Last Dose  . betamethasone acetate-betamethasone sodium phosphate (CELESTONE) injection 3 mg  3 mg Intramuscular Once Edrick Kins, DPM        No Known Allergies   Family History  Problem Relation Age of Onset  . Leukemia Mother   . Hypertension Father      Health Habits: Dental Exam: up to date Eye Exam: up to  date Exercise: 5 times/week on average Current exercise activities: walking/running Diet: balanced   Social History   Socioeconomic History  . Marital status: Divorced    Spouse name: Not on file  . Number of children: Not on file  . Years of education: Not on file  . Highest education level: Not on file  Occupational History  . Not on file  Social Needs  . Financial resource strain: Not on file  . Food insecurity:    Worry: Not on file    Inability: Not on file  . Transportation needs:    Medical: Not on file    Non-medical: Not on file  Tobacco Use  . Smoking status: Former Smoker    Types: Cigarettes  . Smokeless tobacco: Never Used  Substance and Sexual Activity  . Alcohol use: Yes    Comment: occ  . Drug use: No  . Sexual activity: Yes    Birth control/protection: None  Lifestyle  . Physical activity:    Days per week: Not on file    Minutes per session: Not on file  . Stress: Not on file  Relationships  . Social connections:    Talks on phone: Not on file    Gets together: Not on file    Attends religious service: Not on file    Active member of club or organization: Not on file    Attends meetings of clubs or organizations: Not  on file    Relationship status: Not on file  . Intimate partner violence:    Fear of current or ex partner: Not on file    Emotionally abused: Not on file    Physically abused: Not on file    Forced sexual activity: Not on file  Other Topics Concern  . Not on file  Social History Narrative  . Not on file   Social History   Substance and Sexual Activity  Alcohol Use Yes   Comment: occ   Social History   Tobacco Use  Smoking Status Former Smoker  . Types: Cigarettes  Smokeless Tobacco Never Used   Social History   Substance and Sexual Activity  Drug Use No    GYN: Sexual Health Menstrual status: regular menses LMP: Patient's last menstrual period was 12/13/2017. Last pap smear: see HM section History of  abnormal pap smears:    Health Maintenance: See under health Maintenance activity for review of completion dates as well. Immunization History  Administered Date(s) Administered  . Influenza,inj,Quad PF,6+ Mos 02/24/2014  . MMR 02/25/2014  . PPD Test 10/21/2013  . Tdap 02/24/2014      Depression Screen-PHQ2/9 Depression screen [facility] 2/9 12/17/2017 01/25/2016 01/14/2016  Decreased Interest 0 0 0  Down, Depressed, Hopeless 0 0 0  PHQ - 2 Score 0 0 0       Depression Severity and Treatment Recommendations:  0-4= None  5-9= Mild / Treatment: Support, educate to call if worse; return in one month  10-14= Moderate / Treatment: Support, watchful waiting; Antidepressant or Psycotherapy  15-19= Moderately severe / Treatment: Antidepressant OR Psychotherapy  >= 20 = Major depression, severe / Antidepressant AND Psychotherapy    Review of Systems   Review of Systems  Constitutional: Negative for chills, fever and weight loss.  HENT: Negative for ear discharge, hearing loss and nosebleeds.   Eyes: Negative for blurred vision, double vision and photophobia.  Respiratory: Negative for cough, shortness of breath and wheezing.   Cardiovascular: Negative for chest pain, palpitations and leg swelling.  Gastrointestinal: Negative for abdominal pain, nausea and vomiting.  Genitourinary: Negative for dysuria, frequency and urgency.  Musculoskeletal: Negative for back pain, myalgias and neck pain.  Skin: Negative for itching and rash.  Neurological: Negative for dizziness, tingling, tremors and headaches.  Psychiatric/Behavioral: Negative for depression and hallucinations. The patient is not nervous/anxious and does not have insomnia.     See HPI for ROS as well.    Objective:   Vitals:   12/17/17 0900  BP: 116/70  Pulse: 77  Resp: 17  Temp: 98.8 F (37.1 C)  TempSrc: Oral  SpO2: 100%  Weight: 185 lb 12.8 oz (84.3 kg)  Height: 5' 1.5" (1.562 m)    Body mass index is 34.54  kg/m.  Physical Exam  Constitutional: She is oriented to person, place, and time. She appears well-developed and well-nourished.  HENT:  Head: Normocephalic and atraumatic.  Nose: Nose normal.  Mouth/Throat: Oropharynx is clear and moist.  Eyes: Pupils are equal, round, and reactive to light. Conjunctivae and EOM are normal.  Neck: Normal range of motion. Neck supple. No thyromegaly present.  Cardiovascular: Normal rate, regular rhythm and normal heart sounds.  No murmur heard. Abdominal: Soft. Bowel sounds are normal. She exhibits no distension and no mass. There is no tenderness. There is no rebound and no guarding.  Musculoskeletal: Normal range of motion. She exhibits no edema.  Neurological: She is alert and oriented to person, place, and time.  Skin: Skin is warm. Capillary refill takes less than 2 seconds.  Psychiatric: She has a normal mood and affect. Her behavior is normal. Judgment and thought content normal.       Assessment/Plan:   Patient was seen for a health maintenance exam.  Counseled the patient on health maintenance issues. Reviewed her health mainteance schedule and ordered appropriate tests (see orders.) Counseled on regular exercise and weight management. Recommend regular eye exams and dental cleaning.   The following issues were addressed today for health maintenance:   Rhonda Goodman was seen today for annual exam.  Diagnoses and all orders for this visit:  Encounter for health maintenance examination in adult Women's Health Maintenance Plan Advised monthly breast exam and annual mammogram Advised dental exam every six months Discussed stress management Discussed pap smear screening guidelines   Prediabetes -     Hemoglobin A1c  Screening, lipid -     Lipid panel -     Comprehensive metabolic panel    Return if symptoms worsen or fail to improve.    Body mass index is 34.54 kg/m.:  Discussed the patient's BMI with patient. The BMI body mass  index is 34.54 kg/m.     No future appointments.  There are no Patient Instructions on file for this visit.

## 2017-12-18 LAB — COMPREHENSIVE METABOLIC PANEL
ALBUMIN: 4.3 g/dL (ref 3.5–5.5)
ALT: 14 IU/L (ref 0–32)
AST: 16 IU/L (ref 0–40)
Albumin/Globulin Ratio: 2.4 — ABNORMAL HIGH (ref 1.2–2.2)
Alkaline Phosphatase: 59 IU/L (ref 39–117)
BUN / CREAT RATIO: 14 (ref 9–23)
BUN: 9 mg/dL (ref 6–24)
Bilirubin Total: 0.3 mg/dL (ref 0.0–1.2)
CALCIUM: 8.7 mg/dL (ref 8.7–10.2)
CO2: 22 mmol/L (ref 20–29)
Chloride: 110 mmol/L — ABNORMAL HIGH (ref 96–106)
Creatinine, Ser: 0.65 mg/dL (ref 0.57–1.00)
GFR calc Af Amer: 122 mL/min/{1.73_m2} (ref >59)
GFR, EST NON AFRICAN AMERICAN: 106 mL/min/{1.73_m2} (ref >59)
GLUCOSE: 75 mg/dL (ref 65–99)
Globulin, Total: 1.8 g/dL (ref 1.5–4.5)
Potassium: 4.2 mmol/L (ref 3.5–5.2)
Sodium: 143 mmol/L (ref 134–144)
Total Protein: 6.1 g/dL (ref 6.0–8.5)

## 2017-12-18 LAB — LIPID PANEL
CHOL/HDL RATIO: 2.8 ratio (ref 0.0–4.4)
Cholesterol, Total: 167 mg/dL (ref 100–199)
HDL: 60 mg/dL (ref >39)
LDL Calculated: 99 mg/dL (ref 0–99)
Triglycerides: 42 mg/dL (ref 0–149)
VLDL CHOLESTEROL CAL: 8 mg/dL (ref 5–40)

## 2017-12-18 LAB — HEMOGLOBIN A1C
ESTIMATED AVERAGE GLUCOSE: 117 mg/dL
HEMOGLOBIN A1C: 5.7 % — AB (ref 4.8–5.6)

## 2017-12-22 DIAGNOSIS — M25562 Pain in left knee: Secondary | ICD-10-CM | POA: Diagnosis not present

## 2017-12-22 DIAGNOSIS — M1712 Unilateral primary osteoarthritis, left knee: Secondary | ICD-10-CM | POA: Diagnosis not present

## 2018-01-05 DIAGNOSIS — M542 Cervicalgia: Secondary | ICD-10-CM | POA: Diagnosis not present

## 2018-01-05 DIAGNOSIS — M25511 Pain in right shoulder: Secondary | ICD-10-CM | POA: Diagnosis not present

## 2018-01-05 DIAGNOSIS — M19011 Primary osteoarthritis, right shoulder: Secondary | ICD-10-CM | POA: Diagnosis not present

## 2018-01-22 ENCOUNTER — Telehealth: Payer: 59 | Admitting: Family

## 2018-01-22 DIAGNOSIS — L259 Unspecified contact dermatitis, unspecified cause: Secondary | ICD-10-CM | POA: Diagnosis not present

## 2018-01-22 MED ORDER — PREDNISONE 10 MG PO TABS: 10.0000 mg | ORAL_TABLET | Freq: Every day | ORAL | 0 refills | Status: DC

## 2018-01-22 NOTE — Progress Notes (Signed)
E Visit for Rash  We are sorry that you are not feeling well. Here is how we plan to help!  Based on what you shared with me it looks like you have contact dermatitis.  Contact dermatitis is a skin rash caused by something that touches the skin and causes irritation or inflammation.  Your skin may be red, swollen, dry, cracked, and itch.  The rash should go away in a few days but can last a few weeks.  If you get a rash, it's important to figure out what caused it so the irritant can be avoided in the future. and I have prescribed Prednisone 10 mg daily for 5 days           HOME CARE:   Take cool showers and avoid direct sunlight.  Apply cool compress or wet dressings.  Take a bath in an oatmeal bath.  Sprinkle content of one Aveeno packet under running faucet with comfortably warm water.  Bathe for 15-20 minutes, 1-2 times daily.  Pat dry with a towel. Do not rub the rash.  Use hydrocortisone cream.  Take an antihistamine like Benadryl for widespread rashes that itch.  The adult dose of Benadryl is 25-50 mg by mouth 4 times daily.  Caution:  This type of medication may cause sleepiness.  Do not drink alcohol, drive, or operate dangerous machinery while taking antihistamines.  Do not take these medications if you have prostate enlargement.  Read package instructions thoroughly on all medications that you take.  GET HELP RIGHT AWAY IF:   Symptoms don't go away after treatment.  Severe itching that persists.  If you rash spreads or swells.  If you rash begins to smell.  If it blisters and opens or develops a yellow-brown crust.  You develop a fever.  You have a sore throat.  You become short of breath.  MAKE SURE YOU:  Understand these instructions. Will watch your condition. Will get help right away if you are not doing well or get worse.  Thank you for choosing an e-visit. Your e-visit answers were reviewed by a board certified advanced clinical practitioner to  complete your personal care plan. Depending upon the condition, your plan could have included both over the counter or prescription medications. Please review your pharmacy choice. Be sure that the pharmacy you have chosen is open so that you can pick up your prescription now.  If there is a problem you may message your provider in MyChart to have the prescription routed to another pharmacy. Your safety is important to us. If you have drug allergies check your prescription carefully.  For the next 24 hours, you can use MyChart to ask questions about today's visit, request a non-urgent call back, or ask for a work or school excuse from your e-visit provider. You will get an email in the next two days asking about your experience. I hope that your e-visit has been valuable and will speed your recovery.     

## 2018-01-25 DIAGNOSIS — M47816 Spondylosis without myelopathy or radiculopathy, lumbar region: Secondary | ICD-10-CM | POA: Diagnosis not present

## 2018-01-25 DIAGNOSIS — Z6836 Body mass index (BMI) 36.0-36.9, adult: Secondary | ICD-10-CM | POA: Diagnosis not present

## 2018-02-03 DIAGNOSIS — M5416 Radiculopathy, lumbar region: Secondary | ICD-10-CM | POA: Diagnosis not present

## 2018-02-03 DIAGNOSIS — M5126 Other intervertebral disc displacement, lumbar region: Secondary | ICD-10-CM | POA: Diagnosis not present

## 2018-02-09 ENCOUNTER — Ambulatory Visit (INDEPENDENT_AMBULATORY_CARE_PROVIDER_SITE_OTHER): Payer: 59 | Admitting: Physician Assistant

## 2018-02-09 DIAGNOSIS — Z0184 Encounter for antibody response examination: Secondary | ICD-10-CM | POA: Diagnosis not present

## 2018-02-09 DIAGNOSIS — Z789 Other specified health status: Secondary | ICD-10-CM

## 2018-02-09 NOTE — Progress Notes (Signed)
Nurse visit only. I did not see this pt.

## 2018-02-10 DIAGNOSIS — Z6836 Body mass index (BMI) 36.0-36.9, adult: Secondary | ICD-10-CM | POA: Diagnosis not present

## 2018-02-10 DIAGNOSIS — M47816 Spondylosis without myelopathy or radiculopathy, lumbar region: Secondary | ICD-10-CM | POA: Diagnosis not present

## 2018-02-10 LAB — MEASLES/MUMPS/RUBELLA IMMUNITY
MUMPS ABS, IGG: 52 [AU]/ml (ref >10.9)
RUBEOLA AB, IGG: >300 [AU]/ml (ref >16.4)
Rubella Antibodies, IGG: 12.2 index (ref >0.99)

## 2018-03-08 DIAGNOSIS — M7542 Impingement syndrome of left shoulder: Secondary | ICD-10-CM | POA: Diagnosis not present

## 2018-03-08 DIAGNOSIS — M7541 Impingement syndrome of right shoulder: Secondary | ICD-10-CM | POA: Diagnosis not present

## 2018-03-23 DIAGNOSIS — M7542 Impingement syndrome of left shoulder: Secondary | ICD-10-CM | POA: Diagnosis not present

## 2018-03-23 DIAGNOSIS — M7541 Impingement syndrome of right shoulder: Secondary | ICD-10-CM | POA: Diagnosis not present

## 2018-04-05 DIAGNOSIS — M130 Polyarthritis, unspecified: Secondary | ICD-10-CM | POA: Diagnosis not present

## 2018-04-05 DIAGNOSIS — Z6838 Body mass index (BMI) 38.0-38.9, adult: Secondary | ICD-10-CM | POA: Diagnosis not present

## 2018-04-05 DIAGNOSIS — R7309 Other abnormal glucose: Secondary | ICD-10-CM | POA: Diagnosis not present

## 2018-04-26 DIAGNOSIS — M25511 Pain in right shoulder: Secondary | ICD-10-CM | POA: Diagnosis not present

## 2018-04-26 DIAGNOSIS — M7551 Bursitis of right shoulder: Secondary | ICD-10-CM | POA: Diagnosis not present

## 2018-04-26 DIAGNOSIS — M19011 Primary osteoarthritis, right shoulder: Secondary | ICD-10-CM | POA: Diagnosis not present

## 2018-05-03 DIAGNOSIS — H43399 Other vitreous opacities, unspecified eye: Secondary | ICD-10-CM | POA: Diagnosis not present

## 2018-05-03 DIAGNOSIS — H5213 Myopia, bilateral: Secondary | ICD-10-CM | POA: Diagnosis not present

## 2018-05-14 DIAGNOSIS — M7541 Impingement syndrome of right shoulder: Secondary | ICD-10-CM | POA: Diagnosis not present

## 2018-05-14 DIAGNOSIS — M7542 Impingement syndrome of left shoulder: Secondary | ICD-10-CM | POA: Diagnosis not present

## 2018-05-18 ENCOUNTER — Other Ambulatory Visit: Payer: Self-pay | Admitting: Orthopedic Surgery

## 2018-05-18 DIAGNOSIS — M7541 Impingement syndrome of right shoulder: Secondary | ICD-10-CM

## 2018-05-31 DIAGNOSIS — M25512 Pain in left shoulder: Secondary | ICD-10-CM | POA: Diagnosis not present

## 2018-05-31 DIAGNOSIS — M25511 Pain in right shoulder: Secondary | ICD-10-CM | POA: Diagnosis not present

## 2018-06-08 ENCOUNTER — Ambulatory Visit: Payer: 59

## 2018-06-11 ENCOUNTER — Ambulatory Visit: Payer: 59

## 2018-06-25 ENCOUNTER — Ambulatory Visit: Payer: 59

## 2018-06-25 ENCOUNTER — Ambulatory Visit: Payer: Self-pay

## 2018-09-08 DIAGNOSIS — M25511 Pain in right shoulder: Secondary | ICD-10-CM | POA: Diagnosis not present

## 2018-09-08 DIAGNOSIS — M7541 Impingement syndrome of right shoulder: Secondary | ICD-10-CM | POA: Diagnosis not present

## 2018-09-08 DIAGNOSIS — M7542 Impingement syndrome of left shoulder: Secondary | ICD-10-CM | POA: Diagnosis not present

## 2018-10-05 IMAGING — CT CT ABD-PELV W/ CM
2 of 5 series · 16 of 46 positions shown, 18 images · IV contrast (agent unspecified)
Comparison: None.

CLINICAL DATA: Bilateral flank and lower pelvic pain, worse on the
right.

EXAM:
CT ABDOMEN AND PELVIS WITH CONTRAST
TECHNIQUE: Multidetector CT imaging of the abdomen and pelvis was performed
using the standard protocol following bolus administration of
intravenous contrast.
CONTRAST:  100 mL [id number]

[Series 2: abd/pel with · axial · 0.80mm/px · z∈[-336,+54]mm · 13 of 90 slices shown, 15 images]
[im 6/90  soft-tissue]
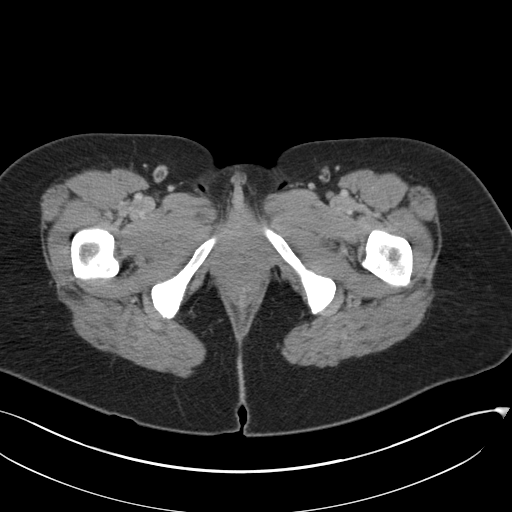
[im 6/90  bone]
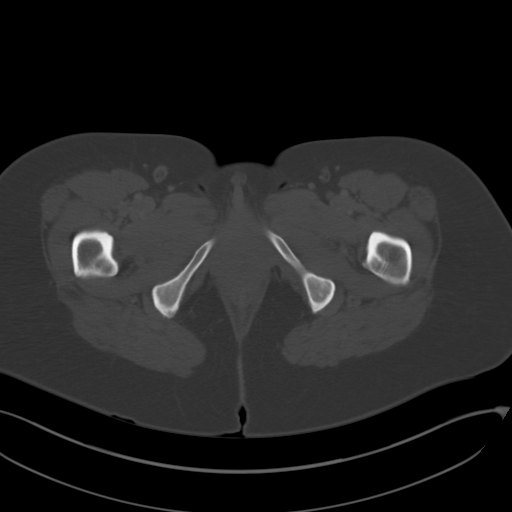
[im 12/90  soft-tissue]
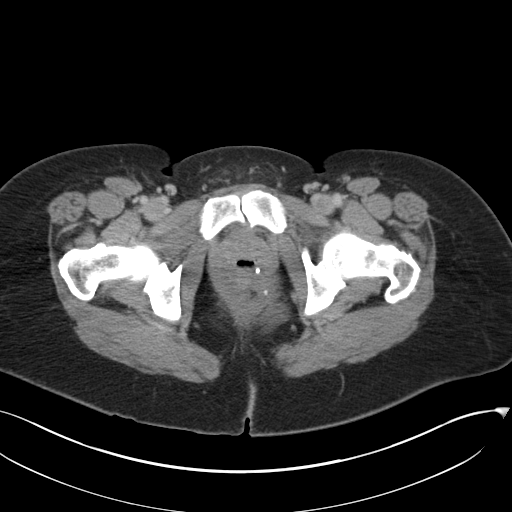
[im 18/90  soft-tissue]
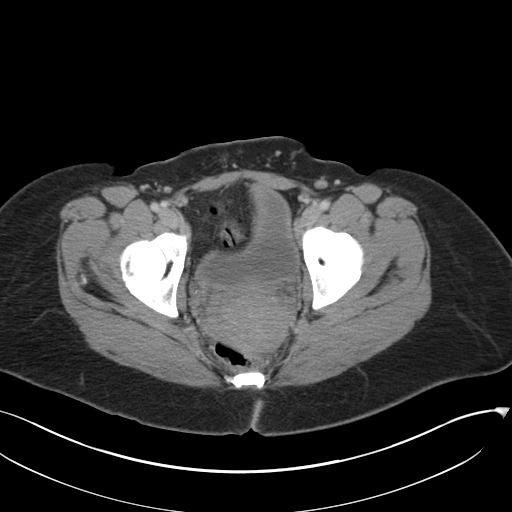
[im 24/90  soft-tissue]
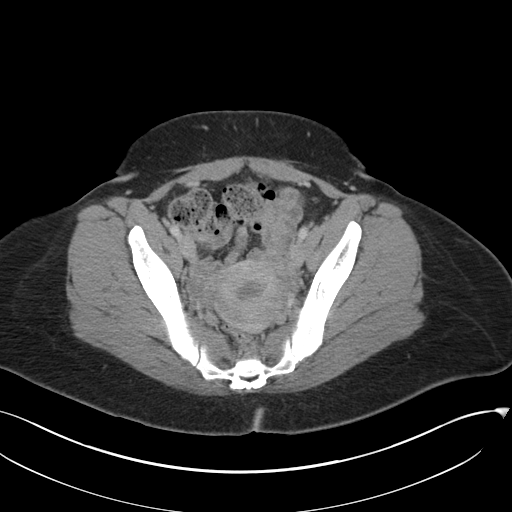
[im 30/90  soft-tissue]
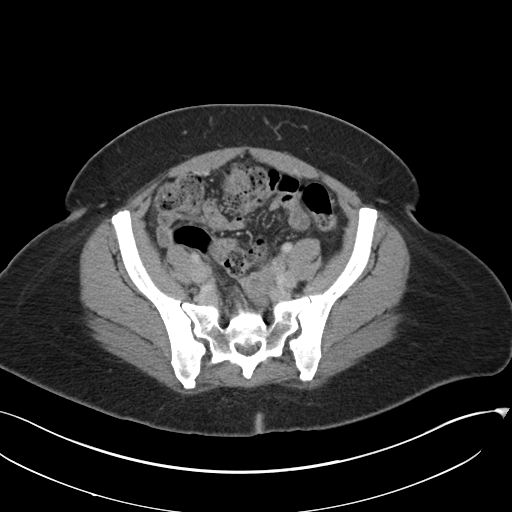
[im 36/90  soft-tissue]
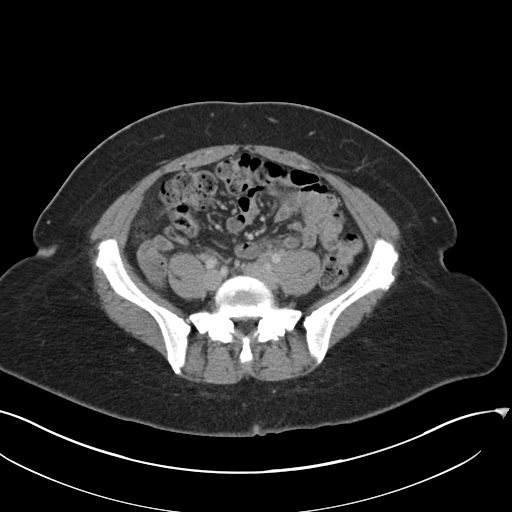
[im 48/90  soft-tissue]
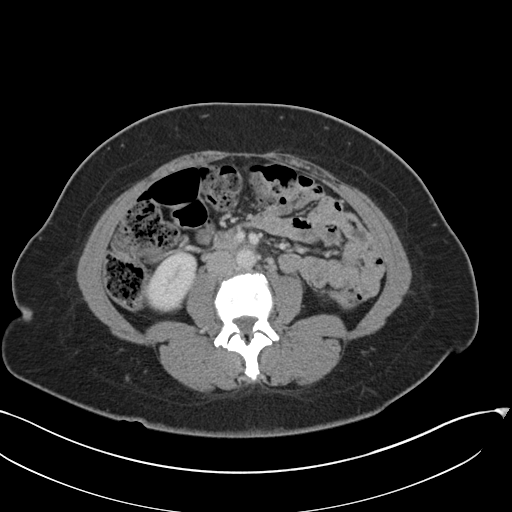
[im 54/90  soft-tissue]
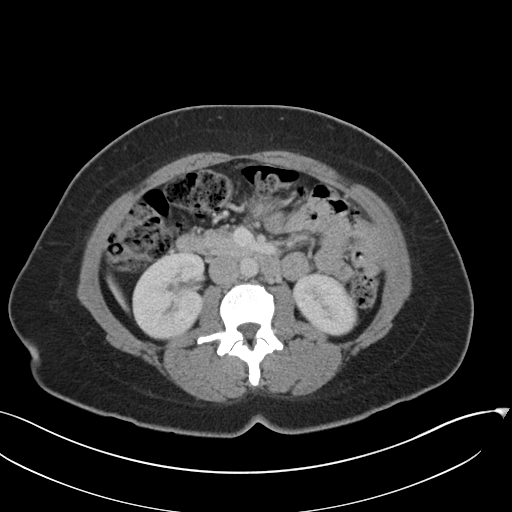
[im 60/90  soft-tissue]
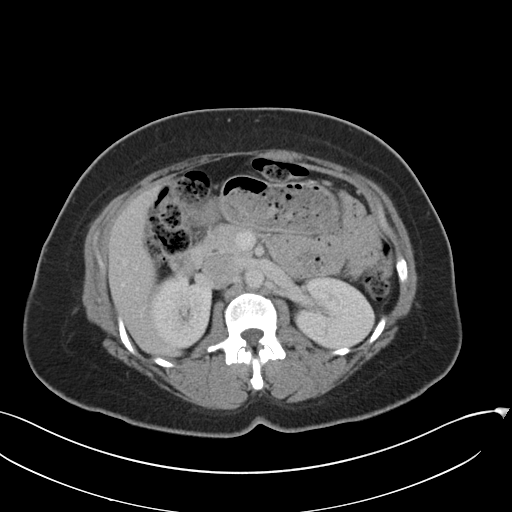
[im 60/90  bone]
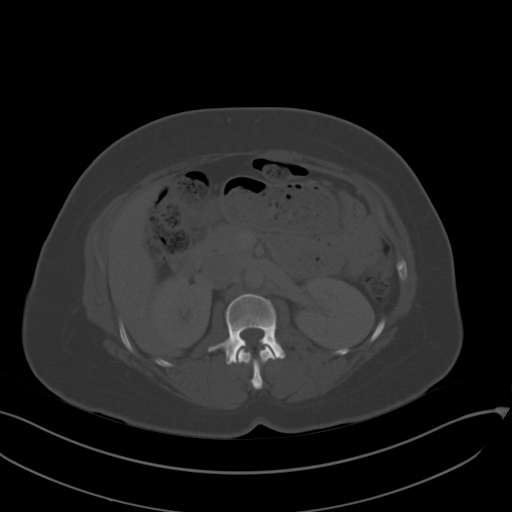
[im 66/90  soft-tissue]
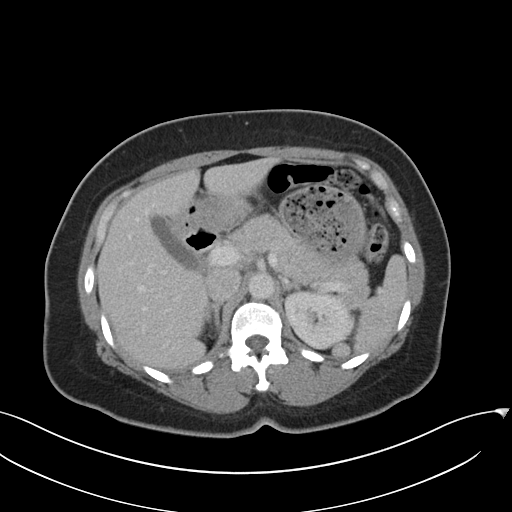
[im 72/90  soft-tissue]
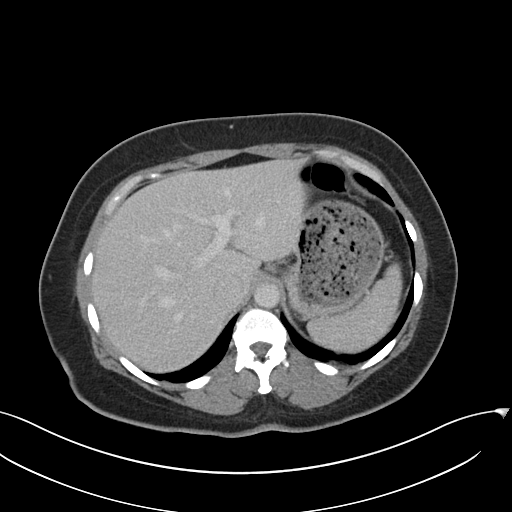
[im 78/90  soft-tissue]
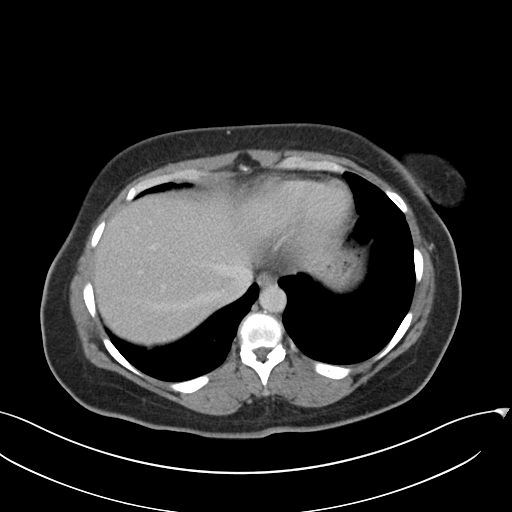
[im 84/90  soft-tissue]
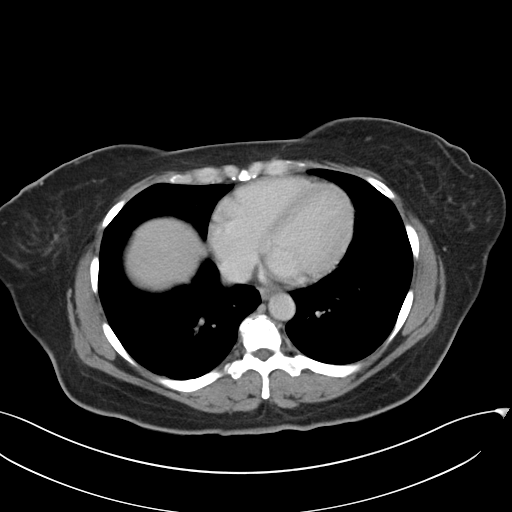

[Series 4: coronal a/|p · coronal · 0.74mm/px · 3 of 150 slices shown]
[im 50/150  soft-tissue]
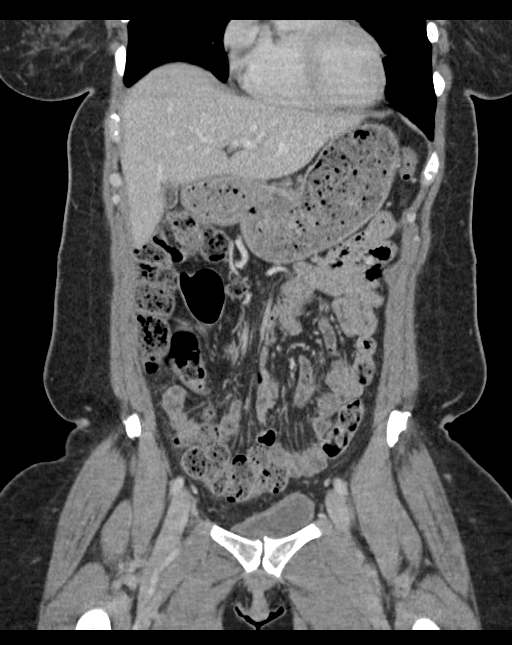
[im 67/150  soft-tissue]
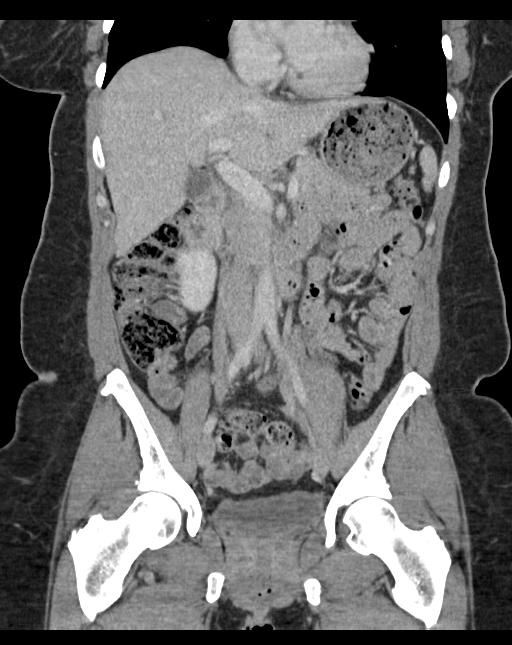
[im 83/150  soft-tissue]
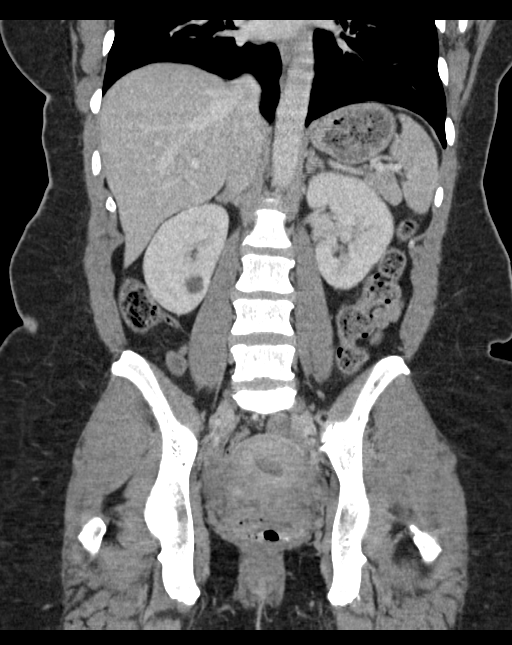

[16 of 46 positions shown; findings below may reference images not displayed]

FINDINGS: Lower chest: Focal volume loss along the medial base of the right
middle lobe. No pleural effusions.

Hepatobiliary: Normal appearance of the liver, gallbladder and
portal venous system.

Pancreas: Normal appearance of the pancreas without inflammation or
duct dilatation.

Spleen: Normal appearance of spleen without enlargement. Small
accessory spleen.

Adrenals/Urinary Tract: Normal adrenal glands. 1.4 cm hypodensity in
the right kidney lower pole is suggestive for a cyst. There may be
an additional small cyst in the medial right kidney which is too
small to definitively characterize. No hydronephrosis. No definite
kidney stones. Urinary bladder is unremarkable. Calcification along
the posterior left side of the bladder probably represents a
phlebolith.

Stomach/Bowel: Appendix is normal. No evidence for bowel obstruction
or focal bowel inflammation.

Vascular/Lymphatic: No significant vascular findings are present. No
enlarged abdominal or pelvic lymph nodes.

Reproductive: Uterus and bilateral adnexa are unremarkable.

Other: Trace fluid in the cul-de-sac is probably physiologic.

Musculoskeletal: No acute abnormality.
IMPRESSION: No acute abnormality in the abdomen or pelvis.

Trace pelvic fluid is likely physiologic.

Small amount of volume loss in the right middle lobe.

Small right renal cysts.

## 2018-10-26 ENCOUNTER — Other Ambulatory Visit: Payer: Self-pay | Admitting: Family Medicine

## 2018-10-26 DIAGNOSIS — Z1231 Encounter for screening mammogram for malignant neoplasm of breast: Secondary | ICD-10-CM

## 2018-11-30 DIAGNOSIS — M5416 Radiculopathy, lumbar region: Secondary | ICD-10-CM | POA: Diagnosis not present

## 2018-11-30 DIAGNOSIS — R030 Elevated blood-pressure reading, without diagnosis of hypertension: Secondary | ICD-10-CM | POA: Diagnosis not present

## 2018-11-30 DIAGNOSIS — Z6841 Body Mass Index (BMI) 40.0 and over, adult: Secondary | ICD-10-CM | POA: Diagnosis not present

## 2018-11-30 DIAGNOSIS — M5126 Other intervertebral disc displacement, lumbar region: Secondary | ICD-10-CM | POA: Diagnosis not present

## 2018-12-13 ENCOUNTER — Ambulatory Visit: Payer: 59

## 2019-01-21 DIAGNOSIS — M130 Polyarthritis, unspecified: Secondary | ICD-10-CM | POA: Diagnosis not present

## 2019-01-21 DIAGNOSIS — R7309 Other abnormal glucose: Secondary | ICD-10-CM | POA: Diagnosis not present

## 2019-02-03 DIAGNOSIS — Z6839 Body mass index (BMI) 39.0-39.9, adult: Secondary | ICD-10-CM | POA: Diagnosis not present

## 2019-02-03 DIAGNOSIS — E669 Obesity, unspecified: Secondary | ICD-10-CM | POA: Diagnosis not present

## 2019-02-03 DIAGNOSIS — M2550 Pain in unspecified joint: Secondary | ICD-10-CM | POA: Diagnosis not present

## 2019-02-03 DIAGNOSIS — R768 Other specified abnormal immunological findings in serum: Secondary | ICD-10-CM | POA: Diagnosis not present

## 2019-02-17 DIAGNOSIS — N898 Other specified noninflammatory disorders of vagina: Secondary | ICD-10-CM | POA: Diagnosis not present

## 2019-02-17 DIAGNOSIS — N760 Acute vaginitis: Secondary | ICD-10-CM | POA: Diagnosis not present

## 2019-02-17 DIAGNOSIS — R309 Painful micturition, unspecified: Secondary | ICD-10-CM | POA: Diagnosis not present

## 2019-02-17 DIAGNOSIS — Z113 Encounter for screening for infections with a predominantly sexual mode of transmission: Secondary | ICD-10-CM | POA: Diagnosis not present

## 2019-03-30 DIAGNOSIS — M5416 Radiculopathy, lumbar region: Secondary | ICD-10-CM | POA: Diagnosis not present

## 2019-03-30 DIAGNOSIS — M5126 Other intervertebral disc displacement, lumbar region: Secondary | ICD-10-CM | POA: Diagnosis not present

## 2019-06-10 DIAGNOSIS — H43399 Other vitreous opacities, unspecified eye: Secondary | ICD-10-CM | POA: Diagnosis not present

## 2019-06-10 DIAGNOSIS — H5213 Myopia, bilateral: Secondary | ICD-10-CM | POA: Diagnosis not present

## 2019-06-17 ENCOUNTER — Encounter (INDEPENDENT_AMBULATORY_CARE_PROVIDER_SITE_OTHER): Payer: 59 | Admitting: Ophthalmology

## 2019-06-17 DIAGNOSIS — H35413 Lattice degeneration of retina, bilateral: Secondary | ICD-10-CM | POA: Diagnosis not present

## 2019-06-17 DIAGNOSIS — H43813 Vitreous degeneration, bilateral: Secondary | ICD-10-CM

## 2019-06-17 DIAGNOSIS — H33303 Unspecified retinal break, bilateral: Secondary | ICD-10-CM | POA: Diagnosis not present

## 2019-06-24 ENCOUNTER — Other Ambulatory Visit: Payer: Self-pay

## 2019-06-24 ENCOUNTER — Encounter (INDEPENDENT_AMBULATORY_CARE_PROVIDER_SITE_OTHER): Payer: 59 | Admitting: Ophthalmology

## 2019-06-24 DIAGNOSIS — H33303 Unspecified retinal break, bilateral: Secondary | ICD-10-CM | POA: Diagnosis not present

## 2019-07-05 ENCOUNTER — Other Ambulatory Visit: Payer: Self-pay

## 2019-07-05 ENCOUNTER — Encounter (INDEPENDENT_AMBULATORY_CARE_PROVIDER_SITE_OTHER): Payer: 59 | Admitting: Ophthalmology

## 2019-07-05 DIAGNOSIS — H33303 Unspecified retinal break, bilateral: Secondary | ICD-10-CM | POA: Diagnosis not present

## 2019-07-08 DIAGNOSIS — M7541 Impingement syndrome of right shoulder: Secondary | ICD-10-CM | POA: Diagnosis not present

## 2019-07-08 DIAGNOSIS — M7542 Impingement syndrome of left shoulder: Secondary | ICD-10-CM | POA: Diagnosis not present

## 2019-07-14 DIAGNOSIS — Z1231 Encounter for screening mammogram for malignant neoplasm of breast: Secondary | ICD-10-CM | POA: Diagnosis not present

## 2019-07-14 DIAGNOSIS — N762 Acute vulvitis: Secondary | ICD-10-CM | POA: Diagnosis not present

## 2019-07-14 DIAGNOSIS — Z3202 Encounter for pregnancy test, result negative: Secondary | ICD-10-CM | POA: Diagnosis not present

## 2019-07-14 DIAGNOSIS — Z6839 Body mass index (BMI) 39.0-39.9, adult: Secondary | ICD-10-CM | POA: Diagnosis not present

## 2019-07-14 DIAGNOSIS — Z01419 Encounter for gynecological examination (general) (routine) without abnormal findings: Secondary | ICD-10-CM | POA: Diagnosis not present

## 2019-07-14 DIAGNOSIS — Z113 Encounter for screening for infections with a predominantly sexual mode of transmission: Secondary | ICD-10-CM | POA: Diagnosis not present

## 2019-07-15 DIAGNOSIS — Z113 Encounter for screening for infections with a predominantly sexual mode of transmission: Secondary | ICD-10-CM | POA: Diagnosis not present

## 2019-07-15 DIAGNOSIS — Z13220 Encounter for screening for lipoid disorders: Secondary | ICD-10-CM | POA: Diagnosis not present

## 2019-07-15 DIAGNOSIS — Z13228 Encounter for screening for other metabolic disorders: Secondary | ICD-10-CM | POA: Diagnosis not present

## 2019-07-15 DIAGNOSIS — Z1329 Encounter for screening for other suspected endocrine disorder: Secondary | ICD-10-CM | POA: Diagnosis not present

## 2019-07-15 DIAGNOSIS — Z1321 Encounter for screening for nutritional disorder: Secondary | ICD-10-CM | POA: Diagnosis not present

## 2019-07-22 DIAGNOSIS — M25511 Pain in right shoulder: Secondary | ICD-10-CM | POA: Diagnosis not present

## 2019-07-22 DIAGNOSIS — M25512 Pain in left shoulder: Secondary | ICD-10-CM | POA: Diagnosis not present

## 2019-07-23 ENCOUNTER — Ambulatory Visit
Admission: EM | Admit: 2019-07-23 | Discharge: 2019-07-23 | Disposition: A | Discharge status: Home / Self Care | Payer: 59 | Attending: Family Medicine | Admitting: Family Medicine

## 2019-07-23 ENCOUNTER — Encounter: Payer: Self-pay | Admitting: Emergency Medicine

## 2019-07-23 ENCOUNTER — Other Ambulatory Visit: Payer: Self-pay

## 2019-07-23 DIAGNOSIS — B9689 Other specified bacterial agents as the cause of diseases classified elsewhere: Secondary | ICD-10-CM | POA: Diagnosis not present

## 2019-07-23 DIAGNOSIS — N3001 Acute cystitis with hematuria: Secondary | ICD-10-CM | POA: Insufficient documentation

## 2019-07-23 DIAGNOSIS — N760 Acute vaginitis: Secondary | ICD-10-CM | POA: Diagnosis not present

## 2019-07-23 LAB — URINALYSIS, COMPLETE (UACMP) WITH MICROSCOPIC
Bilirubin Urine: NEGATIVE
Glucose, UA: NEGATIVE mg/dL
Ketones, ur: NEGATIVE mg/dL
Nitrite: NEGATIVE
Protein, ur: NEGATIVE mg/dL
Specific Gravity, Urine: 1.025 (ref 1.005–1.030)
pH: 6 (ref 5.0–8.0)

## 2019-07-23 LAB — WET PREP, GENITAL
Sperm: NONE SEEN
Trich, Wet Prep: NONE SEEN
Yeast Wet Prep HPF POC: NONE SEEN

## 2019-07-23 MED ORDER — NITROFURANTOIN MONOHYD MACRO 100 MG PO CAPS: 100.0000 mg | ORAL_CAPSULE | Freq: Two times a day (BID) | ORAL | 0 refills | Status: DC

## 2019-07-23 MED ORDER — FLUCONAZOLE 150 MG PO TABS: ORAL_TABLET | ORAL | 0 refills | Status: DC

## 2019-07-23 MED ORDER — METRONIDAZOLE 500 MG PO TABS: 500.0000 mg | ORAL_TABLET | Freq: Two times a day (BID) | ORAL | 0 refills | Status: DC

## 2019-07-23 NOTE — Discharge Instructions (Addendum)
Please take antibiotics as prescribed.  Please avoid sexual activity until completion of antibiotics and resolution of BV symptoms.  Take fluconazole, 1 tablet today and 1 tablet after completion of all antibiotics.  Return to the urgent care for any increasing pain, fevers, worsening symptoms or urgent changes in your health
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
## 2019-07-23 NOTE — ED Provider Notes (Signed)
[facility]    CSN: [id number] Arrival date & time: 07/23/19  0903      History   Chief Complaint Chief Complaint  Patient presents with  . SEXUALLY TRANSMITTED DISEASE    HPI Rhonda Goodman is a 49 y.o. female presents to the urgent care facility for evaluation of vaginal burning, itching, discharge and increase in urinary frequency.  Symptoms been present for 2 to 3 days.  She had been recently diagnosed with BV, treated with metronidazole but had not been taking her medication as prescribed and has continued to have sexual intercourse.  She denies any rashes, ulcerations.  Has a history of syphilis but had recent RPR and titers showing titers trending down.  She denies any fevers, back pain.  No nausea or vomiting.  She also feels that she has a yeast infection.  HPI  Past Medical History:  Diagnosis Date  . Carpal tunnel syndrome on right 03/10/2017    Patient Active Problem List   Diagnosis Date Noted  . Carpal tunnel syndrome on right 03/10/2017  . Heel spur 08/23/2015  . Plantar fasciitis 08/23/2015  . 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 Bony exostosis 08/23/2015    Past Surgical History:  Procedure Laterality Date  . CESAREAN SECTION      OB History   No obstetric history on file.      Home Medications    Prior to Admission medications   Medication Sig Start Date End Date Taking? Authorizing Provider  traMADol (ULTRAM) 50 MG tablet Take 50 mg by mouth every 6 (six) hours as needed. 07/08/19  Yes [provider]  dicyclomine (BENTYL) 20 MG tablet Take 1 tablet (20 mg total) by mouth 2 (two) times daily as needed for spasms. Patient not taking: Reported on 12/17/2017 08/30/16   Julianne Rice, MD  docusate sodium (COLACE) 100 MG capsule Take 1 capsule (100 mg total) by mouth 2 (two) times daily as needed for mild constipation or moderate constipation. Patient not taking: Reported on 12/17/2017 08/30/16   Julianne Rice, MD    fluconazole (DIFLUCAN) 150 MG tablet Take 1 tablet today, take 1 tablet after completion of antibiotics. 07/23/19   Duanne Guess, PA-C  guaiFENesin-codeine 100-10 MG/5ML syrup Take 10 mLs by mouth every 6 (six) hours as needed. Patient not taking: Reported on 12/17/2017 08/28/16   Veryl Speak, MD  metroNIDAZOLE (FLAGYL) 500 MG tablet Take 1 tablet (500 mg total) by mouth 2 (two) times daily. 07/23/19   Duanne Guess, PA-C  Multiple Vitamins-Minerals (ALIVE ONCE DAILY WOMENS 50+ PO) Take 1 capsule by mouth daily.    [provider]  nitrofurantoin, macrocrystal-monohydrate, (MACROBID) 100 MG capsule Take 1 capsule (100 mg total) by mouth 2 (two) times daily. 07/23/19   Duanne Guess, PA-C  polyethylene glycol (MIRALAX / GLYCOLAX) packet Take 17 g by mouth daily as needed for moderate constipation or severe constipation. Patient not taking: Reported on 12/17/2017 08/30/16   Julianne Rice, MD  predniSONE (DELTASONE) 10 MG tablet Take 1 tablet (10 mg total) by mouth daily with breakfast. 01/22/18   Sharion Balloon, FNP    Family History Family History  Problem Relation Age of Onset  . Leukemia Mother   . Hypertension Father     Social History Social History   Tobacco Use  . Smoking status: Light Tobacco Smoker    Types: Cigarettes  . Smokeless tobacco: Never Used  . Tobacco comment: smokes ocassionally  Substance Use Topics  .  Alcohol use: Not Currently    Comment: occ  . Drug use: No     Allergies   Patient has no known allergies.   Review of Systems Review of Systems  Constitutional: Negative for fever.  Respiratory: Negative for shortness of breath.   Cardiovascular: Negative for chest pain.  Gastrointestinal: Negative for nausea and vomiting.  Genitourinary: Positive for frequency, hematuria, vaginal discharge and vaginal pain. Negative for dysuria, flank pain, pelvic pain and vaginal bleeding.  Skin: Negative for rash.     Physical Exam Triage  Vital Signs ED Triage Vitals [07/23/19 0920]  Enc Vitals Group     BP (!) 144/91     Pulse Rate 68     Resp 18     Temp 98.7 F (37.1 C)     Temp Source Oral     SpO2 98 %     Weight 203 lb (92.1 kg)     Height 5\' 1"  (1.549 m)     Head Circumference      Peak Flow      Pain Score 0     Pain Loc      Pain Edu?      Excl. in GC?    No data found.  Updated Vital Signs BP (!) 144/91 (BP Location: Left Arm)   Pulse 68   Temp 98.7 F (37.1 C) (Oral)   Resp 18   Ht 5\' 1"  (1.549 m)   Wt 203 lb (92.1 kg)   LMP 05/22/2019 (Approximate)   SpO2 98%   BMI 38.36 kg/m   Visual Acuity Right Eye Distance:   Left Eye Distance:   Bilateral Distance:    Right Eye Near:   Left Eye Near:    Bilateral Near:     Physical Exam Constitutional:      Appearance: She is well-developed.  HENT:     Head: Normocephalic and atraumatic.  Eyes:     Conjunctiva/sclera: Conjunctivae normal.  Cardiovascular:     Rate and Rhythm: Normal rate.  Pulmonary:     Effort: Pulmonary effort is normal. No respiratory distress.  Abdominal:     General: There is no distension.     Palpations: Abdomen is soft.     Tenderness: There is no abdominal tenderness.  Musculoskeletal:        General: Normal range of motion.     Cervical back: Normal range of motion.  Skin:    General: Skin is warm.     Findings: No rash.  Neurological:     Mental Status: She is alert and oriented to person, place, and time.  Psychiatric:        Behavior: Behavior normal.        Thought Content: Thought content normal.      UC Treatments / Results  Labs (all labs ordered are listed, but only abnormal results are displayed) Labs Reviewed  WET PREP, GENITAL - Abnormal; Notable for the following components:      Result Value   Clue Cells Wet Prep HPF POC PRESENT (*)    WBC, Wet Prep HPF POC MODERATE (*)    All other components within normal limits  URINALYSIS, COMPLETE (UACMP) WITH MICROSCOPIC - Abnormal; Notable  for the following components:   Hgb urine dipstick MODERATE (*)    Leukocytes,Ua MODERATE (*)    Bacteria, UA FEW (*)    All other components within normal limits    EKG   Radiology No results found.  Procedures Procedures (including  critical care time)  Medications Ordered in UC Medications - No data to display  Initial Impression / Assessment and Plan / UC Course  I have reviewed the triage vital signs and the nursing notes.  Pertinent labs & imaging results that were available during my care of the patient were reviewed by me and considered in my medical decision making (see chart for details).     49 year old female with history and exam as well as laboratory findings concerning for BV and UTI.  She is treated with metronidazole, Macrobid.  She will avoid sexual intercourse until symptoms have cleared and she has completed all antibiotics.  She is given fluconazole to take 1 tablet now and 1 tablet after completion of antibiotics.  She understands signs and symptoms to return to the clinic for. Final Clinical Impressions(s) / UC Diagnoses   Final diagnoses:  Bacterial vaginitis  Acute cystitis with hematuria     Discharge Instructions     Please take antibiotics as prescribed.  Please avoid sexual activity until completion of antibiotics and resolution of BV symptoms.  Take fluconazole, 1 tablet today and 1 tablet after completion of all antibiotics.  Return to the urgent care for any increasing pain, fevers, worsening symptoms or urgent changes in your health   ED Prescriptions    Medication Sig Dispense Auth. Provider   nitrofurantoin, macrocrystal-monohydrate, (MACROBID) 100 MG capsule Take 1 capsule (100 mg total) by mouth 2 (two) times daily. 10 capsule Evon Slack, PA-C   metroNIDAZOLE (FLAGYL) 500 MG tablet Take 1 tablet (500 mg total) by mouth 2 (two) times daily. 14 tablet Evon Slack, PA-C   fluconazole (DIFLUCAN) 150 MG tablet Take 1 tablet today,  take 1 tablet after completion of antibiotics. 5 tablet Evon Slack, PA-C     PDMP not reviewed this encounter.   Evon Slack, PA-C 07/23/19 1016

## 2019-07-30 DIAGNOSIS — M25511 Pain in right shoulder: Secondary | ICD-10-CM | POA: Diagnosis not present

## 2019-08-01 ENCOUNTER — Other Ambulatory Visit: Payer: Self-pay | Admitting: Physician Assistant

## 2019-08-01 DIAGNOSIS — M7541 Impingement syndrome of right shoulder: Secondary | ICD-10-CM

## 2019-08-09 ENCOUNTER — Emergency Department
Admission: EM | Admit: 2019-08-09 | Discharge: 2019-08-09 | Disposition: A | Discharge status: Home / Self Care | Payer: 59 | Attending: Emergency Medicine | Admitting: Emergency Medicine

## 2019-08-09 ENCOUNTER — Other Ambulatory Visit: Payer: Self-pay

## 2019-08-09 ENCOUNTER — Encounter: Payer: Self-pay | Admitting: Emergency Medicine

## 2019-08-09 DIAGNOSIS — F17210 Nicotine dependence, cigarettes, uncomplicated: Secondary | ICD-10-CM | POA: Insufficient documentation

## 2019-08-09 DIAGNOSIS — Z20822 Contact with and (suspected) exposure to covid-19: Secondary | ICD-10-CM | POA: Insufficient documentation

## 2019-08-09 DIAGNOSIS — B349 Viral infection, unspecified: Secondary | ICD-10-CM | POA: Diagnosis not present

## 2019-08-09 DIAGNOSIS — R109 Unspecified abdominal pain: Secondary | ICD-10-CM | POA: Diagnosis present

## 2019-08-09 LAB — COMPREHENSIVE METABOLIC PANEL
ALT: 18 U/L (ref 0–44)
AST: 16 U/L (ref 15–41)
Albumin: 3.6 g/dL (ref 3.5–5.0)
Alkaline Phosphatase: 52 U/L (ref 38–126)
Anion gap: 10 (ref 5–15)
BUN: 16 mg/dL (ref 6–20)
CO2: 24 mmol/L (ref 22–32)
Calcium: 8.4 mg/dL — ABNORMAL LOW (ref 8.9–10.3)
Chloride: 103 mmol/L (ref 98–111)
Creatinine, Ser: 0.77 mg/dL (ref 0.44–1.00)
GFR calc Af Amer: >60 mL/min (ref >60)
GFR calc non Af Amer: >60 mL/min (ref >60)
Glucose, Bld: 100 mg/dL — ABNORMAL HIGH (ref 70–99)
Potassium: 3.7 mmol/L (ref 3.5–5.1)
Sodium: 137 mmol/L (ref 135–145)
Total Bilirubin: 0.8 mg/dL (ref 0.3–1.2)
Total Protein: 6.5 g/dL (ref 6.5–8.1)

## 2019-08-09 LAB — CBC
HCT: 42.4 % (ref 36.0–46.0)
Hemoglobin: 13.6 g/dL (ref 12.0–15.0)
MCH: 29.4 pg (ref 26.0–34.0)
MCHC: 32.1 g/dL (ref 30.0–36.0)
MCV: 91.8 fL (ref 80.0–100.0)
Platelets: 302 10*3/uL (ref 150–400)
RBC: 4.62 MIL/uL (ref 3.87–5.11)
RDW: 13.2 % (ref 11.5–15.5)
WBC: 8.6 10*3/uL (ref 4.0–10.5)
nRBC: 0 % (ref 0.0–0.2)

## 2019-08-09 LAB — URINALYSIS, COMPLETE (UACMP) WITH MICROSCOPIC
Bilirubin Urine: NEGATIVE
Glucose, UA: NEGATIVE mg/dL
Hgb urine dipstick: NEGATIVE
Ketones, ur: NEGATIVE mg/dL
Leukocytes,Ua: NEGATIVE
Nitrite: NEGATIVE
Protein, ur: NEGATIVE mg/dL
Specific Gravity, Urine: 1.02 (ref 1.005–1.030)
pH: 7 (ref 5.0–8.0)

## 2019-08-09 LAB — LIPASE, BLOOD: Lipase: 20 U/L (ref 11–51)

## 2019-08-09 MED ORDER — SODIUM CHLORIDE 0.9 % IV BOLUS
500.0000 mL | Freq: Once | INTRAVENOUS | Status: AC
  Administered 2019-08-09: 500 mL via INTRAVENOUS

## 2019-08-09 MED ORDER — ONDANSETRON 4 MG PO TBDP: 4.0000 mg | ORAL_TABLET | Freq: Three times a day (TID) | ORAL | 0 refills | Status: DC | PRN

## 2019-08-09 MED ORDER — ONDANSETRON 4 MG PO TBDP
4.0000 mg | ORAL_TABLET | Freq: Once | ORAL | Status: AC | PRN
  Administered 2019-08-09: 4 mg via ORAL
  Filled 2019-08-09: qty 1

## 2019-08-09 NOTE — ED Provider Notes (Signed)
[facility] Emergency Department Provider Note   ____________________________________________    I have reviewed the triage vital signs and the nursing notes.   HISTORY  Chief Complaint Abdominal Pain, Diarrhea, and Headache     HPI Rhonda Goodman is a 49 y.o. female who presents with complaints of abdominal cramping, diarrhea, headache, fatigue, chills.  Patient reports symptoms started yesterday with a headache and fatigue which then progressed to diarrhea and abdominal cramping.  No nausea or vomiting currently.  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  Has not take anything for this.  Nothing seems to make it better or worse  Past Medical History:  Diagnosis Date  . Carpal tunnel syndrome on right 03/10/2017    Patient Active Problem List   Diagnosis Date Noted  . Carpal tunnel syndrome on right 03/10/2017  . Heel spur 08/23/2015  . Plantar fasciitis 08/23/2015  . 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 Bony exostosis 08/23/2015    Past Surgical History:  Procedure Laterality Date  . CESAREAN SECTION      Prior to Admission medications   Medication Sig Start Date End Date Taking? Authorizing Provider  dicyclomine (BENTYL) 20 MG tablet Take 1 tablet (20 mg total) by mouth 2 (two) times daily as needed for spasms. Patient not taking: Reported on 12/17/2017 08/30/16   Loren Racer, MD  docusate sodium (COLACE) 100 MG capsule Take 1 capsule (100 mg total) by mouth 2 (two) times daily as needed for mild constipation or moderate constipation. Patient not taking: Reported on 12/17/2017 08/30/16   Loren Racer, MD  fluconazole (DIFLUCAN) 150 MG tablet Take 1 tablet today, take 1 tablet after completion of antibiotics. 07/23/19   Evon Slack, PA-C  guaiFENesin-codeine 100-10 MG/5ML syrup Take 10 mLs by mouth every 6 (six) hours as needed. Patient not taking: Reported on  12/17/2017 08/28/16   Geoffery Lyons, MD  metroNIDAZOLE (FLAGYL) 500 MG tablet Take 1 tablet (500 mg total) by mouth 2 (two) times daily. 07/23/19   Evon Slack, PA-C  Multiple Vitamins-Minerals (ALIVE ONCE DAILY WOMENS 50+ PO) Take 1 capsule by mouth daily.    [provider]  nitrofurantoin, macrocrystal-monohydrate, (MACROBID) 100 MG capsule Take 1 capsule (100 mg total) by mouth 2 (two) times daily. 07/23/19   Evon Slack, PA-C  ondansetron (ZOFRAN ODT) 4 MG disintegrating tablet Take 1 tablet (4 mg total) by mouth every 8 (eight) hours as needed. 08/09/19   Jene Every, MD  polyethylene glycol [facility] / Ethelene Hal) packet Take 17 g by mouth daily as needed for moderate constipation or severe constipation. Patient not taking: Reported on 12/17/2017 08/30/16   Loren Racer, MD  predniSONE (DELTASONE) 10 MG tablet Take 1 tablet (10 mg total) by mouth daily with breakfast. 01/22/18   Junie Spencer, FNP  traMADol (ULTRAM) 50 MG tablet Take 50 mg by mouth every 6 (six) hours as needed. 07/08/19   [provider]     Allergies Patient has no known allergies.  Family History  Problem Relation Age of Onset  . Leukemia Mother   . Hypertension Father     Social History Social History   Tobacco Use  . Smoking status: Light Tobacco Smoker    Types: Cigarettes  . Smokeless tobacco: Never Used  Substance Use Topics  . Alcohol use: Not Currently  . Drug use: No    Review of Systems  Constitutional: As above Eyes: No  visual changes.  ENT: No sore throat. Cardiovascular: Denies chest pain. Respiratory: No cough Gastrointestinal: As above Genitourinary: Negative for dysuria. Musculoskeletal: Myalgias Skin: Negative for rash. Neurological: Positive headache, no weakness   ____________________________________________   PHYSICAL EXAM:  VITAL SIGNS: ED Triage Vitals  Enc Vitals Group     BP 08/09/19 1130 116/77     Pulse Rate 08/09/19 1130 (!) 122      Resp 08/09/19 1130 17     Temp 08/09/19 1130 99.1 F (37.3 C)     Temp Source 08/09/19 1130 Oral     SpO2 08/09/19 1130 96 %     Weight 08/09/19 1127 87.1 kg (192 lb)     Height 08/09/19 1127 1.549 m (5\' 1" )     Head Circumference --      Peak Flow --      Pain Score 08/09/19 1127 6     Pain Loc --      Pain Edu? --      Excl. in GC? --     Constitutional: Alert and oriented.    Head: Atraumatic. Nose: No congestion/rhinnorhea. Mouth/Throat: Mucous membranes are moist.   Neck:  Painless ROM Cardiovascular: Mild tachycardia, regular rhythm.  Good peripheral circulation. Respiratory: Normal respiratory effort.  No retractions.  Gastrointestinal: Soft and nontender. No distention.    Musculoskeletal: No lower extremity tenderness nor edema.  Warm and well perfused Neurologic:  Normal speech and language. No gross focal neurologic deficits are appreciated.  Skin:  Skin is warm, dry and intact. No rash noted. Psychiatric: Mood and affect are normal. Speech and behavior are normal.  ____________________________________________   LABS (all labs ordered are listed, but only abnormal results are displayed)  Labs Reviewed  COMPREHENSIVE METABOLIC PANEL - Abnormal; Notable for the following components:      Result Value   Glucose, Bld 100 (*)    Calcium 8.4 (*)    All other components within normal limits  URINALYSIS, COMPLETE (UACMP) WITH MICROSCOPIC - Abnormal; Notable for the following components:   Color, Urine YELLOW (*)    APPearance CLEAR (*)    Bacteria, UA RARE (*)    All other components within normal limits  SARS CORONAVIRUS 2 (TAT 6-24 HRS)  LIPASE, BLOOD  CBC   ____________________________________________  EKG  None ____________________________________________  RADIOLOGY  None ____________________________________________   PROCEDURES  Procedure(s) performed: No  Procedures   Critical Care performed:  No ____________________________________________   INITIAL IMPRESSION / ASSESSMENT AND PLAN / ED COURSE  Pertinent labs & imaging results that were available during my care of the patient were reviewed by me and considered in my medical decision making (see chart for details).  Patient presents with diarrhea, abdominal cramping, fatigue, myalgias, chills concerning for viral illness, possibly COVID-19 given exposures.  Lab work today is reassuring with normal white blood cell count, normal chemistries, normal lipase.  We will send Covid PCR test, recommend quarantine until results are returned.  Rhonda Goodman was evaluated in Emergency Department on 08/09/2019 for the symptoms described in the history of present illness. She was evaluated in the context of the global COVID-19 pandemic, which necessitated consideration that the patient might be at risk for infection with the SARS-CoV-2 virus that causes COVID-19. Institutional protocols and algorithms that pertain to the evaluation of patients at risk for COVID-19 are in a state of rapid change based on information released by regulatory bodies including the CDC and federal and state organizations. These policies and algorithms were followed  during the patient's care in the ED.     ____________________________________________   FINAL CLINICAL IMPRESSION(S) / ED DIAGNOSES  Final diagnoses:  Viral illness  Contact with and (suspected) exposure to covid-19        Note:  This document was prepared using Dragon voice recognition software and may include unintentional dictation errors.   Lavonia Drafts, MD 08/09/19 [phone]

## 2019-08-09 NOTE — ED Triage Notes (Signed)
Pt in via POV, complaints of sudden onset headache, nausea, diarrhea since yesterday.  Reports generalized abdominal pain as well, denies any vomiting.  Appears uncomfortable in triage.

## 2019-08-09 NOTE — Discharge Instructions (Signed)
If your coronavirus test is negative please wait until free of symptoms for 24 hours prior to returning to work

## 2019-08-10 LAB — SARS CORONAVIRUS 2 (TAT 6-24 HRS): SARS Coronavirus 2: NEGATIVE

## 2019-08-11 ENCOUNTER — Ambulatory Visit
Admission: RE | Admit: 2019-08-11 | Discharge: 2019-08-11 | Disposition: A | Discharge status: Home / Self Care | Payer: 59 | Source: Ambulatory Visit | Attending: Physician Assistant | Admitting: Physician Assistant

## 2019-08-11 ENCOUNTER — Other Ambulatory Visit: Payer: Self-pay

## 2019-08-11 DIAGNOSIS — S43491A Other sprain of right shoulder joint, initial encounter: Secondary | ICD-10-CM | POA: Insufficient documentation

## 2019-08-11 DIAGNOSIS — M19011 Primary osteoarthritis, right shoulder: Secondary | ICD-10-CM | POA: Diagnosis not present

## 2019-08-11 DIAGNOSIS — X58XXXA Exposure to other specified factors, initial encounter: Secondary | ICD-10-CM | POA: Diagnosis not present

## 2019-08-11 DIAGNOSIS — S43431A Superior glenoid labrum lesion of right shoulder, initial encounter: Secondary | ICD-10-CM | POA: Diagnosis not present

## 2019-08-11 DIAGNOSIS — M7541 Impingement syndrome of right shoulder: Secondary | ICD-10-CM | POA: Insufficient documentation

## 2019-08-11 MED ORDER — LIDOCAINE HCL (PF) 1 % IJ SOLN
5.0000 mL | Freq: Once | INTRAMUSCULAR | Status: AC
  Administered 2019-08-11: 10:00:00 5 mL
  Filled 2019-08-11: qty 5

## 2019-08-11 MED ORDER — IOHEXOL 180 MG/ML  SOLN
10.0000 mL | Freq: Once | INTRAMUSCULAR | Status: AC | PRN
  Administered 2019-08-11: 7 mL via INTRA_ARTICULAR

## 2019-08-11 MED ORDER — SODIUM CHLORIDE (PF) 0.9 % IJ SOLN
10.0000 mL | Freq: Once | INTRAMUSCULAR | Status: AC
  Administered 2019-08-11: 13 mL

## 2019-08-11 MED ORDER — GADOBUTROL 1 MMOL/ML IV SOLN
0.0500 mL | Freq: Once | INTRAVENOUS | Status: AC | PRN
  Administered 2019-08-11: 10:00:00 0.01 mL

## 2019-09-09 DIAGNOSIS — M7531 Calcific tendinitis of right shoulder: Secondary | ICD-10-CM | POA: Diagnosis not present

## 2019-09-09 DIAGNOSIS — S43431A Superior glenoid labrum lesion of right shoulder, initial encounter: Secondary | ICD-10-CM | POA: Diagnosis not present

## 2019-09-29 DIAGNOSIS — I10 Essential (primary) hypertension: Secondary | ICD-10-CM | POA: Diagnosis not present

## 2019-09-29 DIAGNOSIS — E6609 Other obesity due to excess calories: Secondary | ICD-10-CM | POA: Diagnosis not present

## 2019-09-29 DIAGNOSIS — R7309 Other abnormal glucose: Secondary | ICD-10-CM | POA: Diagnosis not present

## 2019-09-29 DIAGNOSIS — M130 Polyarthritis, unspecified: Secondary | ICD-10-CM | POA: Diagnosis not present

## 2019-11-02 ENCOUNTER — Other Ambulatory Visit: Payer: Self-pay

## 2019-11-02 ENCOUNTER — Encounter (INDEPENDENT_AMBULATORY_CARE_PROVIDER_SITE_OTHER): Payer: 59 | Admitting: Ophthalmology

## 2019-11-02 DIAGNOSIS — H33303 Unspecified retinal break, bilateral: Secondary | ICD-10-CM

## 2019-11-02 DIAGNOSIS — H43813 Vitreous degeneration, bilateral: Secondary | ICD-10-CM

## 2019-11-10 DIAGNOSIS — R7309 Other abnormal glucose: Secondary | ICD-10-CM | POA: Diagnosis not present

## 2019-11-10 DIAGNOSIS — M130 Polyarthritis, unspecified: Secondary | ICD-10-CM | POA: Diagnosis not present

## 2019-12-02 DIAGNOSIS — Z23 Encounter for immunization: Secondary | ICD-10-CM | POA: Diagnosis not present

## 2019-12-23 DIAGNOSIS — Z23 Encounter for immunization: Secondary | ICD-10-CM | POA: Diagnosis not present

## 2020-01-04 DIAGNOSIS — M5126 Other intervertebral disc displacement, lumbar region: Secondary | ICD-10-CM | POA: Diagnosis not present

## 2020-01-04 DIAGNOSIS — M5416 Radiculopathy, lumbar region: Secondary | ICD-10-CM | POA: Diagnosis not present

## 2020-01-04 DIAGNOSIS — Z6839 Body mass index (BMI) 39.0-39.9, adult: Secondary | ICD-10-CM | POA: Diagnosis not present

## 2020-04-25 DIAGNOSIS — M25511 Pain in right shoulder: Secondary | ICD-10-CM | POA: Diagnosis not present

## 2020-05-02 DIAGNOSIS — M5416 Radiculopathy, lumbar region: Secondary | ICD-10-CM | POA: Diagnosis not present

## 2020-05-02 DIAGNOSIS — M5126 Other intervertebral disc displacement, lumbar region: Secondary | ICD-10-CM | POA: Diagnosis not present

## 2020-05-02 DIAGNOSIS — Z6839 Body mass index (BMI) 39.0-39.9, adult: Secondary | ICD-10-CM | POA: Diagnosis not present

## 2020-06-20 ENCOUNTER — Other Ambulatory Visit (HOSPITAL_COMMUNITY): Payer: Self-pay | Admitting: Family Medicine

## 2020-08-09 MED FILL — OZEMPIC (1 MG/DOSE) 4 MG/3M: 4 | 84 days supply | Qty: 9 | Fill #0

## 2022-06-23 DIAGNOSIS — G5602 Carpal tunnel syndrome, left upper limb: Secondary | ICD-10-CM | POA: Diagnosis not present

## 2022-06-23 DIAGNOSIS — G5601 Carpal tunnel syndrome, right upper limb: Secondary | ICD-10-CM | POA: Diagnosis not present

## 2022-07-24 ENCOUNTER — Other Ambulatory Visit: Payer: Self-pay | Admitting: Surgery

## 2022-07-25 ENCOUNTER — Encounter: Payer: Self-pay | Admitting: *Deleted

## 2022-07-25 ENCOUNTER — Encounter
Admission: RE | Admit: 2022-07-25 | Discharge: 2022-07-25 | Disposition: A | Discharge status: Home / Self Care | Payer: 59 | Source: Ambulatory Visit | Attending: Surgery | Admitting: Surgery

## 2022-07-25 VITALS — Ht 60.0 in | Wt 207.0 lb

## 2022-07-25 DIAGNOSIS — Z01812 Encounter for preprocedural laboratory examination: Secondary | ICD-10-CM

## 2022-07-25 HISTORY — DX: Prediabetes: R73.03

## 2022-07-25 NOTE — Patient Instructions (Addendum)
Your procedure is scheduled on: Wednesday, March 6 Report to the Registration Desk on the 1st floor of the [facility]. To find out your arrival time, please call [phone] between 1PM - 3PM on: Tuesday, March 5 If your arrival time is 6:00 am, do not arrive before that time as the [facility] entrance doors do not open until 6:00 am.  REMEMBER: Instructions that are not followed completely may result in serious medical risk, up to and including death; or upon the discretion of your surgeon and anesthesiologist your surgery may need to be rescheduled.  Do not eat food after midnight the night before surgery.  No gum chewing or hard candies.  You may however, drink CLEAR liquids up to 2 hours before you are scheduled to arrive for your surgery. Do not drink anything within 2 hours of your scheduled arrival time.  Clear liquids include: - water  - apple juice without pulp - gatorade (not RED colors) - black coffee or tea (Do NOT add milk or creamers to the coffee or tea) Do NOT drink anything that is not on this list.  In addition, your doctor has ordered for you to drink the provided:  Ensure Pre-Surgery Clear Carbohydrate Drink  Drinking this carbohydrate drink up to two hours before surgery helps to reduce insulin resistance and improve patient outcomes. Please complete drinking 2 hours before scheduled arrival time.  One week prior to surgery: starting February 28 Stop Anti-inflammatories (NSAIDS) such as Advil, Aleve, Ibuprofen, Motrin, Naproxen, Naprosyn and Aspirin based products such as Excedrin, Goody's Powder, BC Powder. Stop ANY OVER THE COUNTER supplements until after surgery. You may however, continue to take Tylenol if needed for pain up until the day of surgery.  No Alcohol for 24 hours before or after surgery.  No Smoking including e-cigarettes for 24 hours before surgery.  No chewable tobacco products for at least 6 hours before surgery.  No nicotine patches on  the day of surgery.  Do not use any "recreational" drugs for at least a week (preferably 2 weeks) before your surgery.  Please be advised that the combination of cocaine and anesthesia may have negative outcomes, up to and including death. If you test positive for cocaine, your surgery will be cancelled.  On the morning of surgery brush your teeth with toothpaste and water, you may rinse your mouth with mouthwash if you wish. Do not swallow any toothpaste or mouthwash.  Use CHG Soap as directed on instruction sheet.  Do not wear jewelry, make-up, hairpins, clips or nail polish.  Do not wear lotions, powders, or perfumes.   Do not shave body hair from the neck down 48 hours before surgery.  Contact lenses, hearing aids and dentures may not be worn into surgery.  Do not bring valuables to the hospital. [facility] is not responsible for any missing/lost belongings or valuables.   Notify your doctor if there is any change in your medical condition (cold, fever, infection).  Wear comfortable clothing (specific to your surgery type) to the hospital.  After surgery, you can help prevent lung complications by doing breathing exercises.  Take deep breaths and cough every 1-2 hours. Your doctor may order a device called an Incentive Spirometer to help you take deep breaths.  If you are being discharged the day of surgery, you will not be allowed to drive home. You will need a responsible individual to drive you home and stay with you for 24 hours after surgery.   If  you are taking public transportation, you will need to have a responsible individual with you.  Please call the [facility] at [phone] if you have any questions about these instructions.  Surgery Visitation Policy:  Patients undergoing a surgery or procedure may have two family members or support persons with them as long as the person is not COVID-19 positive or experiencing its symptoms.       Preparing for Surgery with CHLORHEXIDINE GLUCONATE (CHG) Soap  Chlorhexidine Gluconate (CHG) Soap  o An antiseptic cleaner that kills germs and bonds with the skin to continue killing germs even after washing  o Used for showering the night before surgery and morning of surgery  Before surgery, you can play an important role by reducing the number of germs on your skin.  CHG (Chlorhexidine gluconate) soap is an antiseptic cleanser which kills germs and bonds with the skin to continue killing germs even after washing.  Please do not use if you have an allergy to CHG or antibacterial soaps. If your skin becomes reddened/irritated stop using the CHG.  1. Shower the NIGHT BEFORE SURGERY and the MORNING OF SURGERY with CHG soap.  2. If you choose to wash your hair, wash your hair first as usual with your normal shampoo.  3. After shampooing, rinse your hair and body thoroughly to remove the shampoo.  4. Use CHG as you would any other liquid soap. You can apply CHG directly to the skin and wash gently with a scrungie or a clean washcloth.  5. Apply the CHG soap to your body only from the neck down. Do not use on open wounds or open sores. Avoid contact with your eyes, ears, mouth, and genitals (private parts). Wash face and genitals (private parts) with your normal soap.  6. Wash thoroughly, paying special attention to the area where your surgery will be performed.  7. Thoroughly rinse your body with warm water.  8. Do not shower/wash with your normal soap after using and rinsing off the CHG soap.  9. Pat yourself dry with a clean towel.  10. Wear clean pajamas to bed the night before surgery.  12. Place clean sheets on your bed the night of your first shower and do not sleep with pets.  13. Shower again with the CHG soap on the day of surgery prior to arriving at the hospital.  14. Do not apply any deodorants/lotions/powders.  15. Please wear clean clothes to the  hospital.

## 2022-08-06 ENCOUNTER — Encounter
Admission: RE | Disposition: A | Discharge status: Home / Self Care | Payer: Self-pay | Source: Home / Self Care | Attending: Surgery

## 2022-08-06 ENCOUNTER — Ambulatory Visit: Payer: 59 | Admitting: Certified Registered"

## 2022-08-06 ENCOUNTER — Ambulatory Visit
Admission: RE | Admit: 2022-08-06 | Discharge: 2022-08-06 | Disposition: A | Discharge status: Home / Self Care | Payer: 59 | Attending: Surgery | Admitting: Surgery

## 2022-08-06 ENCOUNTER — Other Ambulatory Visit: Payer: Self-pay

## 2022-08-06 ENCOUNTER — Encounter: Payer: Self-pay | Admitting: Surgery

## 2022-08-06 DIAGNOSIS — F17290 Nicotine dependence, other tobacco product, uncomplicated: Secondary | ICD-10-CM | POA: Insufficient documentation

## 2022-08-06 DIAGNOSIS — Z98891 History of uterine scar from previous surgery: Secondary | ICD-10-CM | POA: Insufficient documentation

## 2022-08-06 DIAGNOSIS — R7303 Prediabetes: Secondary | ICD-10-CM | POA: Insufficient documentation

## 2022-08-06 DIAGNOSIS — G5601 Carpal tunnel syndrome, right upper limb: Secondary | ICD-10-CM | POA: Insufficient documentation

## 2022-08-06 DIAGNOSIS — Z6841 Body Mass Index (BMI) 40.0 and over, adult: Secondary | ICD-10-CM | POA: Diagnosis not present

## 2022-08-06 DIAGNOSIS — Z01812 Encounter for preprocedural laboratory examination: Secondary | ICD-10-CM

## 2022-08-06 HISTORY — PX: CARPAL TUNNEL RELEASE: SHX101

## 2022-08-06 LAB — POCT PREGNANCY, URINE: Preg Test, Ur: NEGATIVE

## 2022-08-06 SURGERY — RELEASE, CARPAL TUNNEL, ENDOSCOPIC
Anesthesia: General | Site: Wrist | Laterality: Right

## 2022-08-06 MED ORDER — ORAL CARE MOUTH RINSE: 15.0000 mL | Freq: Once | OROMUCOSAL | Status: AC

## 2022-08-06 MED ORDER — BUPIVACAINE HCL (PF) 0.5 % IJ SOLN
INTRAMUSCULAR | Status: AC
  Filled 2022-08-06: qty 30

## 2022-08-06 MED ORDER — FENTANYL CITRATE (PF) 100 MCG/2ML IJ SOLN
INTRAMUSCULAR | Status: DC | PRN
  Administered 2022-08-06 (×2): 50 ug via INTRAVENOUS

## 2022-08-06 MED ORDER — CHLORHEXIDINE GLUCONATE 0.12 % MT SOLN
OROMUCOSAL | Status: AC
  Filled 2022-08-06: qty 15

## 2022-08-06 MED ORDER — 0.9 % SODIUM CHLORIDE (POUR BTL) OPTIME
TOPICAL | Status: DC | PRN
  Administered 2022-08-06: 250 mL

## 2022-08-06 MED ORDER — DROPERIDOL 2.5 MG/ML IJ SOLN: 0.6250 mg | Freq: Once | INTRAMUSCULAR | Status: DC | PRN

## 2022-08-06 MED ORDER — CEFAZOLIN SODIUM-DEXTROSE 2-4 GM/100ML-% IV SOLN
INTRAVENOUS | Status: AC
  Filled 2022-08-06: qty 100

## 2022-08-06 MED ORDER — ONDANSETRON HCL 4 MG/2ML IJ SOLN: 4.0000 mg | Freq: Four times a day (QID) | INTRAMUSCULAR | Status: DC | PRN

## 2022-08-06 MED ORDER — CEFAZOLIN SODIUM-DEXTROSE 2-4 GM/100ML-% IV SOLN
2.0000 g | INTRAVENOUS | Status: AC
  Administered 2022-08-06: 2 g via INTRAVENOUS

## 2022-08-06 MED ORDER — TRAMADOL HCL 50 MG PO TABS: 50.0000 mg | ORAL_TABLET | Freq: Four times a day (QID) | ORAL | 0 refills | Status: AC | PRN

## 2022-08-06 MED ORDER — PROPOFOL 10 MG/ML IV BOLUS
INTRAVENOUS | Status: DC | PRN
  Administered 2022-08-06: 170 mg via INTRAVENOUS

## 2022-08-06 MED ORDER — METOCLOPRAMIDE HCL 5 MG/ML IJ SOLN: 5.0000 mg | Freq: Three times a day (TID) | INTRAMUSCULAR | Status: DC | PRN

## 2022-08-06 MED ORDER — FAMOTIDINE 20 MG PO TABS: 20.0000 mg | ORAL_TABLET | Freq: Once | ORAL | Status: DC

## 2022-08-06 MED ORDER — LIDOCAINE HCL (CARDIAC) PF 100 MG/5ML IV SOSY
PREFILLED_SYRINGE | INTRAVENOUS | Status: DC | PRN
  Administered 2022-08-06: 100 mg via INTRAVENOUS

## 2022-08-06 MED ORDER — PROMETHAZINE HCL 25 MG/ML IJ SOLN: 6.2500 mg | INTRAMUSCULAR | Status: DC | PRN

## 2022-08-06 MED ORDER — KETOROLAC TROMETHAMINE 30 MG/ML IJ SOLN
INTRAMUSCULAR | Status: DC | PRN
  Administered 2022-08-06: 30 mg via INTRAVENOUS

## 2022-08-06 MED ORDER — MIDAZOLAM HCL 2 MG/2ML IJ SOLN
INTRAMUSCULAR | Status: DC | PRN
  Administered 2022-08-06: 2 mg via INTRAVENOUS

## 2022-08-06 MED ORDER — ONDANSETRON HCL 4 MG/2ML IJ SOLN
INTRAMUSCULAR | Status: DC | PRN
  Administered 2022-08-06: 4 mg via INTRAVENOUS

## 2022-08-06 MED ORDER — OXYCODONE HCL 5 MG PO TABS: 5.0000 mg | ORAL_TABLET | Freq: Once | ORAL | Status: DC | PRN

## 2022-08-06 MED ORDER — ACETAMINOPHEN 10 MG/ML IV SOLN: 1000.0000 mg | Freq: Once | INTRAVENOUS | Status: DC | PRN

## 2022-08-06 MED ORDER — FENTANYL CITRATE (PF) 100 MCG/2ML IJ SOLN: 25.0000 ug | INTRAMUSCULAR | Status: DC | PRN

## 2022-08-06 MED ORDER — FENTANYL CITRATE (PF) 100 MCG/2ML IJ SOLN
INTRAMUSCULAR | Status: AC
  Filled 2022-08-06: qty 2

## 2022-08-06 MED ORDER — CHLORHEXIDINE GLUCONATE 0.12 % MT SOLN
15.0000 mL | Freq: Once | OROMUCOSAL | Status: AC
  Administered 2022-08-06: 15 mL via OROMUCOSAL

## 2022-08-06 MED ORDER — BUPIVACAINE HCL (PF) 0.5 % IJ SOLN
INTRAMUSCULAR | Status: DC | PRN
  Administered 2022-08-06: 10 mL

## 2022-08-06 MED ORDER — FAMOTIDINE 20 MG PO TABS
ORAL_TABLET | ORAL | Status: AC
  Administered 2022-08-06: 20 mg
  Filled 2022-08-06: qty 1

## 2022-08-06 MED ORDER — TRAMADOL HCL 50 MG PO TABS: 50.0000 mg | ORAL_TABLET | Freq: Four times a day (QID) | ORAL | Status: DC | PRN

## 2022-08-06 MED ORDER — OXYCODONE HCL 5 MG/5ML PO SOLN: 5.0000 mg | Freq: Once | ORAL | Status: DC | PRN

## 2022-08-06 MED ORDER — MIDAZOLAM HCL 2 MG/2ML IJ SOLN
INTRAMUSCULAR | Status: AC
  Filled 2022-08-06: qty 2

## 2022-08-06 MED ORDER — SODIUM CHLORIDE 0.9 % IV SOLN: INTRAVENOUS | Status: DC

## 2022-08-06 MED ORDER — ONDANSETRON HCL 4 MG PO TABS: 4.0000 mg | ORAL_TABLET | Freq: Four times a day (QID) | ORAL | Status: DC | PRN

## 2022-08-06 MED ORDER — LACTATED RINGERS IV SOLN: INTRAVENOUS | Status: DC

## 2022-08-06 MED ORDER — METOCLOPRAMIDE HCL 10 MG PO TABS: 5.0000 mg | ORAL_TABLET | Freq: Three times a day (TID) | ORAL | Status: DC | PRN

## 2022-08-06 MED ORDER — GLYCOPYRROLATE 0.2 MG/ML IJ SOLN
INTRAMUSCULAR | Status: DC | PRN
  Administered 2022-08-06: .2 mg via INTRAVENOUS

## 2022-08-06 MED ORDER — DEXAMETHASONE SODIUM PHOSPHATE 10 MG/ML IJ SOLN
INTRAMUSCULAR | Status: DC | PRN
  Administered 2022-08-06: 8 mg via INTRAVENOUS

## 2022-08-06 MED ORDER — KETOROLAC TROMETHAMINE 30 MG/ML IJ SOLN: 30.0000 mg | Freq: Once | INTRAMUSCULAR | Status: DC

## 2022-08-06 MED ORDER — PROPOFOL 1000 MG/100ML IV EMUL
INTRAVENOUS | Status: AC
  Filled 2022-08-06: qty 100

## 2022-08-06 SURGICAL SUPPLY — 38 items
APL PRP STRL LF DISP 70% ISPRP (MISCELLANEOUS) ×2
BNDG ADH 2 X3.75 FABRIC TAN LF (GAUZE/BANDAGES/DRESSINGS) IMPLANT
BNDG ADH XL 3.75X2 STRCH LF (GAUZE/BANDAGES/DRESSINGS) ×1
BNDG CMPR 5X4 CHSV STRCH STRL (GAUZE/BANDAGES/DRESSINGS) ×1
BNDG COHESIVE 4X5 TAN STRL LF (GAUZE/BANDAGES/DRESSINGS) ×2 IMPLANT
BNDG ELASTIC 2X5.8 VLCR STR LF (GAUZE/BANDAGES/DRESSINGS) ×2 IMPLANT
BNDG ESMARCH 4 X 12 STRL LF (GAUZE/BANDAGES/DRESSINGS) ×1
BNDG ESMARCH 4X12 STRL LF (GAUZE/BANDAGES/DRESSINGS) ×2 IMPLANT
CHLORAPREP W/TINT 26 (MISCELLANEOUS) ×2 IMPLANT
CORD BIP STRL DISP 12FT (MISCELLANEOUS) ×2 IMPLANT
CUFF TOURN SGL QUICK 18X4 (TOURNIQUET CUFF) ×2 IMPLANT
DRAPE SURG 17X11 SM STRL (DRAPES) ×2 IMPLANT
FORCEPS JEWEL BIP 4-3/4 STR (INSTRUMENTS) ×2 IMPLANT
GAUZE SPONGE 4X4 12PLY STRL (GAUZE/BANDAGES/DRESSINGS) ×2 IMPLANT
GAUZE XEROFORM 1X8 LF (GAUZE/BANDAGES/DRESSINGS) ×2 IMPLANT
GAUZE XEROFORM 4X4 STRL (GAUZE/BANDAGES/DRESSINGS) IMPLANT
GLOVE BIO SURGEON STRL SZ8 (GLOVE) ×2 IMPLANT
GLOVE SURG UNDER LTX SZ8 (GLOVE) ×2 IMPLANT
GOWN STRL REUS W/ TWL LRG LVL3 (GOWN DISPOSABLE) ×2 IMPLANT
GOWN STRL REUS W/ TWL XL LVL3 (GOWN DISPOSABLE) ×2 IMPLANT
GOWN STRL REUS W/TWL LRG LVL3 (GOWN DISPOSABLE) ×1
GOWN STRL REUS W/TWL XL LVL3 (GOWN DISPOSABLE) ×1
KIT CARPAL TUNNEL (MISCELLANEOUS) ×1
KIT ESCP INSRT D SLOT CANN KN (MISCELLANEOUS) ×2 IMPLANT
KIT TURNOVER KIT A (KITS) ×2 IMPLANT
MANIFOLD NEPTUNE II (INSTRUMENTS) ×2 IMPLANT
NS IRRIG 500ML POUR BTL (IV SOLUTION) ×2 IMPLANT
PACK EXTREMITY ARMC (MISCELLANEOUS) ×2 IMPLANT
SPLINT WRIST LG LT TX990309 (SOFTGOODS) IMPLANT
SPLINT WRIST LG RT TX900304 (SOFTGOODS) IMPLANT
SPLINT WRIST M LT TX990308 (SOFTGOODS) IMPLANT
SPLINT WRIST M RT TX990303 (SOFTGOODS) IMPLANT
SPLINT WRIST XL LT TX990310 (SOFTGOODS) IMPLANT
SPLINT WRIST XL RT TX990305 (SOFTGOODS) IMPLANT
STOCKINETTE IMPERVIOUS 9X36 MD (GAUZE/BANDAGES/DRESSINGS) ×2 IMPLANT
SUT PROLENE 4 0 PS 2 18 (SUTURE) ×2 IMPLANT
TRAP FLUID SMOKE EVACUATOR (MISCELLANEOUS) ×2 IMPLANT
WATER STERILE IRR 500ML POUR (IV SOLUTION) ×2 IMPLANT

## 2022-08-06 NOTE — H&P (Signed)
History of Present Illness:  Rhonda Goodman is a 52 y.o. female who presents for evaluation and treatment of her bilateral hand and wrist pain and paresthesias, right more symptomatic than left. The patient notes that the symptoms have been present for nearly 5 years and developed without any specific cause or injury. She describes numbness and paresthesias to the thumb, index, long, and ring fingers of her right hand. She has been diagnosed in the past with carpal tunnel syndrome, but has elected not to pursue more definitive treatment. She has been wearing a wrist splint on her right hand as she is right-hand dominant, and taking ibuprofen as necessary with temporary partial relief of her symptoms. Her symptoms are worse at night and are further aggravated by lying on her right side at night. 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 She had undergone an EMG in the past of her right wrist which confirmed the presence of "moderate" carpal tunnel syndrome.   Current Outpatient Medications: clotrimazole (LOTRIMIN) 1 % cream Apply topically once as needed  ergocalciferol, vitamin D2, 1,250 mcg (50,000 unit) capsule Take 1 capsule by mouth every 7 (seven) days  fezolinetant (VEOZAH) 45 mg Tab Take 45 mg by mouth once daily  ondansetron (ZOFRAN-ODT) 4 MG disintegrating tablet Take by mouth once as needed  testosterone 30 mg/actuation (1.5 mL) SlPm Apply topically 3 (three) times a week   Allergies: No Known Allergies  Past Medical History:  Spinal stenosis   Past Surgical History: No pertinent surgical history.   Family History:  Leukemia Mother  High blood pressure (Hypertension) Maternal Grandmother  Diabetes type II Maternal Grandmother   Social History:   Socioeconomic History:  Marital status: Single  Tobacco Use  Smoking status: Former  Smokeless tobacco:  Never  Surveyor, mining Use: Some days  Substance and Sexual Activity  Alcohol use: Yes  Comment: occasionally  Drug use: Never  Sexual activity: Defer   Review of Systems:  A comprehensive 14 point ROS was performed, reviewed, and the pertinent orthopaedic findings are documented in the HPI.  Physical Exam: Vitals:  06/23/22 1046  BP: (!) 132/98  Weight: 97.6 kg (215 lb 3.2 oz)  Height: 154.9 cm ('5\' 1"'$ )  PainSc: 0-No pain  PainLoc: Wrist   General/Constitutional: Pleasant overweight middle-aged female in no acute distress. Neuro/Psych: Normal mood and affect, oriented to person, place and time. Eyes: Non-icteric. Pupils are equal, round, and reactive to light, and exhibit synchronous movement. ENT: Unremarkable. Lymphatic: No palpable adenopathy. Respiratory: Lungs clear to auscultation, Normal chest excursion, No wheezes, and Non-labored breathing Cardiovascular: Regular rate and rhythm. No murmurs. and No edema, swelling or tenderness, except as noted in detailed exam. Integumentary: No impressive skin lesions present, except as noted in detailed exam. Musculoskeletal: Unremarkable, except as noted in detailed exam.  Right wrist/hand exam: Skin inspection of the right wrist and hand is unremarkable. No swelling, erythema, ecchymosis, abrasions, or other skin abnormalities are identified. She has no tenderness to palpation over the dorsal or volar aspects of the wrist, nor does she have any tenderness to palpation over the dorsal or palmar aspects of the hand. She exhibits full active and passive range of motion of the wrist without any pain or catching. She is able to actively flex and extend all digits fully without any pain or triggering. She is able to oppose her thumb to  the base of the little finger. She is neurovascularly intact to all digits of her right hand. She has an equivocally positive Tinel's test over the carpal tunnel, but negative Phalen's test.  EMG results:   The results of a recent EMG performed in February, 2023, are available for review and have been reviewed by myself. By report, this study demonstrates evidence of "moderate" carpal tunnel syndrome affecting the right wrist. This test was not done on the left upper extremity. This report was reviewed by myself and discussed with the patient.  Assessment: Carpal tunnel syndrome, right.   Plan: The treatment options were discussed with the patient. In addition, patient educational materials were provided regarding the diagnosis and treatment options. The patient is quite frustrated by her continued symptoms and function limitations, especially as they pertain to her right wrist and hand, and is ready to consider more aggressive treatment options. Therefore, I have recommended a surgical procedure, specifically an endoscopic right carpal tunnel release. The procedure was discussed with the patient, as were the potential risks (including bleeding, infection, nerve and/or blood vessel injury, persistent or recurrent pain, loosening and/or failure of the components, dislocation, malunion, non-union, leg length inequality, need for further surgery, blood clots, strokes, heart attacks and/or arhythmias, pneumonia, etc.) and benefits. The patient states her understanding and wishes to proceed. All of the patient's questions and concerns were answered. She can call any time with further concerns. She will follow up post-surgery, routine.    H&P reviewed and patient re-examined. No changes.

## 2022-08-06 NOTE — Anesthesia Preprocedure Evaluation (Signed)
Anesthesia Evaluation  Patient identified by MRN, date of birth, ID band Patient awake    Reviewed: Allergy & Precautions, H&P , NPO status , Patient's Chart, lab work & pertinent test results, reviewed documented beta blocker date and time   Airway Mallampati: II   Neck ROM: full    Dental  (+) Poor Dentition   Pulmonary neg pulmonary ROS, Current Smoker and Patient abstained from smoking.   Pulmonary exam normal        Cardiovascular Exercise Tolerance: Good negative cardio ROS Normal cardiovascular exam Rhythm:regular Rate:Normal     Neuro/Psych  Neuromuscular disease  negative psych ROS   GI/Hepatic negative GI ROS, Neg liver ROS,,,  Endo/Other  negative endocrine ROS    Renal/GU negative Renal ROS  negative genitourinary   Musculoskeletal   Abdominal   Peds  Hematology negative hematology ROS (+)   Anesthesia Other Findings Past Medical History: 03/10/2017: Carpal tunnel syndrome on right No date: Pre-diabetes Past Surgical History: 1989: CESAREAN SECTION BMI    Body Mass Index: 40.43 kg/m     Reproductive/Obstetrics negative OB ROS                             Anesthesia Physical Anesthesia Plan  ASA: 3  Anesthesia Plan: General   Post-op Pain Management:    Induction:   PONV Risk Score and Plan: 3  Airway Management Planned:   Additional Equipment:   Intra-op Plan:   Post-operative Plan:   Informed Consent: I have reviewed the patients History and Physical, chart, labs and discussed the procedure including the risks, benefits and alternatives for the proposed anesthesia with the patient or authorized representative who has indicated his/her understanding and acceptance.     Dental Advisory Given  Plan Discussed with: CRNA  Anesthesia Plan Comments:        Anesthesia Quick Evaluation

## 2022-08-06 NOTE — Transfer of Care (Signed)
Immediate Anesthesia Transfer of Care Note  Patient: Rhonda Goodman  Procedure(s) Performed: CARPAL TUNNEL RELEASE ENDOSCOPIC (Right: Wrist)  Patient Location: PACU  Anesthesia Type:General  Level of Consciousness: drowsy  Airway & Oxygen Therapy: Patient Spontanous Breathing and Patient connected to face mask oxygen  Post-op Assessment: Report given to RN  Post vital signs: stable  Last Vitals:  Vitals Value Taken Time  BP 103/73 08/06/22 1039  Temp 36.1 C 08/06/22 1039  Pulse 65 08/06/22 1039  Resp 13 08/06/22 1039  SpO2 98 % 08/06/22 1039  Vitals shown include unvalidated device data.  Last Pain:  Vitals:   08/06/22 0837  TempSrc: Oral  PainSc: 0-No pain      Patients Stated Pain Goal: 0 ([id number])  Complications: No notable events documented.

## 2022-08-06 NOTE — Discharge Instructions (Addendum)
Orthopedic discharge instructions: Keep dressing dry and intact. Keep hand elevated above heart level. May shower after dressing removed on postop day 4 (Sunday). Cover sutures with Band-Aids after drying off, then reapply Velcro splint. Apply ice to affected area frequently. Take ibuprofen 600-800 mg TID with meals for 3-5 days, then as necessary. Take ES Tylenol or pain medication as prescribed when needed.  Return for follow-up in 10-14 days or as scheduled.      AMBULATORY SURGERY  DISCHARGE INSTRUCTIONS   The drugs that you were given will stay in your system until tomorrow so for the next 24 hours you should not:  Drive an automobile Make any legal decisions Drink any alcoholic beverage   You may resume regular meals tomorrow.  Today it is better to start with liquids and gradually work up to solid foods.  You may eat anything you prefer, but it is better to start with liquids, then soup and crackers, and gradually work up to solid foods.   Please notify your doctor immediately if you have any unusual bleeding, trouble breathing, redness and pain at the surgery site, drainage, fever, or pain not relieved by medication.     Your post-operative visit with Dr.                                       is: Date:                        Time:    Please call to schedule your post-operative visit.  Additional Instructions:

## 2022-08-06 NOTE — Anesthesia Procedure Notes (Signed)
Procedure Name: LMA Insertion Date/Time: 08/06/2022 9:57 AM  Performed by: Lerry Liner, CRNAPre-anesthesia Checklist: Patient identified, Emergency Drugs available, Suction available and Patient being monitored Patient Re-evaluated:Patient Re-evaluated prior to induction Oxygen Delivery Method: Circle system utilized Preoxygenation: Pre-oxygenation with 100% oxygen Induction Type: IV induction Ventilation: Mask ventilation without difficulty LMA: LMA inserted LMA Size: 4.0 Number of attempts: 1 Placement Confirmation: positive ETCO2 and breath sounds checked- equal and bilateral Tube secured with: Tape Dental Injury: Teeth and Oropharynx as per pre-operative assessment

## 2022-08-06 NOTE — Op Note (Signed)
08/06/2022  10:57 AM  Patient:   Rhonda Goodman  Pre-Op Diagnosis:   Right carpal tunnel syndrome.  Post-Op Diagnosis:   Same.  Procedure:   Endoscopic right carpal tunnel release.  Surgeon:   Pascal Lux, MD  Anesthesia:   General LMA  Findings:   As above.  Complications:   None  EBL:   0 cc  Fluids:   500 cc crystalloid  TT:   11 minutes at 250 mmHg  Drains:   None  Closure:   4-0 Prolene interrupted sutures  Brief Clinical Note:   The patient is a 52 year old female with a long history of gradually worsening pain and paresthesias to her right hand. Her symptoms have progressed despite medications, activity modification, etc. Her history and examination are consistent with carpal tunnel syndrome, confirmed by EMG. The patient presents at this time for an endoscopic right carpal tunnel release.   Procedure:   The patient was brought into the operating room and lain in the supine position. After adequate general laryngeal mask anesthesia was obtained, the right hand and upper extremity were prepped with ChloraPrep solution before being draped sterilely. Preoperative antibiotics were administered. A timeout was performed to verify the appropriate surgical site before the limb was exsanguinated with an Esmarch and the tourniquet inflated to 250 mmHg.   An approximately 1.5-2 cm incision was made over the volar wrist flexion crease, centered over the palmaris longus tendon. The incision was carried down through the subcutaneous tissues with care taken to identify and protect any neurovascular structures. The distal forearm fascia was penetrated just proximal to the transverse carpal ligament. The soft tissues were released off the superficial and deep surfaces of the distal forearm fascia and this was released proximally for 3-4 cm under direct visualization.  Attention was directed distally. The Soil scientist was passed beneath the transverse carpal ligament along the  ulnar aspect of the carpal tunnel and used to release any adhesions as well as to remove any adherent synovial tissue before first the smaller then the larger of the two dilators were passed beneath the transverse carpal ligament along the ulnar margin of the carpal tunnel. The slotted cannula was introduced and the endoscope was placed into the slotted cannula and the undersurface of the transverse carpal ligament visualized. The distal margin of the transverse carpal ligament was marked by placing a 25-gauge needle percutaneously at Bell cardinal point so that it entered the distal portion of the slotted cannula. Under endoscopic visualization, the transverse carpal ligament was released from proximal to distal using the end-cutting blade. A second pass was performed to ensure complete release of the ligament. The adequacy of release was verified both endoscopically and by palpation using the freer elevator.  The wound was irrigated thoroughly with sterile saline solution before being closed using 4-0 Prolene interrupted sutures. A total of 10 cc of 0.5% plain Sensorcaine was injected in and around the incision before a sterile bulky dressing was applied to the wound. The patient was placed into a volar wrist splint before being awakened, extubated, and returned to the recovery room in satisfactory condition after tolerating the procedure well.

## 2022-08-07 ENCOUNTER — Encounter: Payer: Self-pay | Admitting: Surgery

## 2022-08-07 NOTE — Anesthesia Postprocedure Evaluation (Signed)
Anesthesia Post Note  Patient: Rhonda Goodman  Procedure(s) Performed: CARPAL TUNNEL RELEASE ENDOSCOPIC (Right: Wrist)  Patient location during evaluation: PACU Anesthesia Type: General Level of consciousness: awake and alert Pain management: pain level controlled Vital Signs Assessment: post-procedure vital signs reviewed and stable Respiratory status: spontaneous breathing, nonlabored ventilation, respiratory function stable and patient connected to nasal cannula oxygen Cardiovascular status: blood pressure returned to baseline and stable Postop Assessment: no apparent nausea or vomiting Anesthetic complications: no   No notable events documented.   Last Vitals:  Vitals:   08/06/22 1100 08/06/22 1114  BP: 106/82   Pulse: 71 (!) 57  Resp: 16 16  Temp:  (!) 36.1 C  SpO2: 96% 97%    Last Pain:  Vitals:   08/06/22 1114  TempSrc: Temporal  PainSc: 0-No pain                 Molli Barrows

## 2022-08-25 ENCOUNTER — Ambulatory Visit: Payer: 59 | Attending: Student | Admitting: Occupational Therapy

## 2022-08-25 ENCOUNTER — Encounter: Payer: Self-pay | Admitting: Occupational Therapy

## 2022-08-25 DIAGNOSIS — M6281 Muscle weakness (generalized): Secondary | ICD-10-CM | POA: Insufficient documentation

## 2022-08-25 DIAGNOSIS — L905 Scar conditions and fibrosis of skin: Secondary | ICD-10-CM | POA: Insufficient documentation

## 2022-08-25 DIAGNOSIS — M25531 Pain in right wrist: Secondary | ICD-10-CM | POA: Insufficient documentation

## 2022-08-25 DIAGNOSIS — M25631 Stiffness of right wrist, not elsewhere classified: Secondary | ICD-10-CM | POA: Diagnosis not present

## 2022-08-27 ENCOUNTER — Ambulatory Visit: Payer: 59 | Admitting: Occupational Therapy

## 2022-08-28 NOTE — Therapy (Signed)
[facility] [address] [place], [address] Phone: [phone]   Fax:  [phone]  Occupational Therapy Evaluation  Patient Details  Name: Rhonda Goodman MRN: [id number] Date of Birth: [birth year] Referring Provider (OT): Poggi,   Encounter Date: 08/25/2022   OT End of Session - 08/27/22 2009     Visit Number 1    Number of Visits 17    Date for OT Re-Evaluation 10/20/22    OT Start Time 1200    OT Stop Time 1304    OT Time Calculation (min) 64 min    Activity Tolerance Patient tolerated treatment well    Behavior During Therapy [facility] for tasks assessed/performed             Past Medical History:  Diagnosis Date   Carpal tunnel syndrome on right 03/10/2017   Pre-diabetes     Past Surgical History:  Procedure Laterality Date   CARPAL TUNNEL RELEASE Right 08/06/2022   Procedure: CARPAL TUNNEL RELEASE ENDOSCOPIC;  Surgeon: Corky Mull, MD;  Location: [facility] ORS;  Service: Orthopedics;  Laterality: Right;   CESAREAN SECTION  1989    There were no vitals filed for this visit.   Subjective Assessment - 08/28/22 2044     Subjective  Pt reports she has had more pain since having the stitches out (3-4 days ago).  Started in 2018, was getting injections in wrist.  Had to stop treatment during [place] quarantine .  Had continued pain, numbness, waking up at night, decreased grip.  Had another EMG and underwent surgery 08-06-2022.    Pertinent History Per chart, last MD note: Rhonda Goodman is a 52 y.o. female who presents today for her first postop appointment following a right endoscopic carpal tunnel release. Surgery was performed by Dr. Roland Rack on 08/06/2022. Overall the patient feels that she is doing well. She does report numbness and tingling the right hand and underlying swelling in the right hand as well. The patient denies any falls or trauma affecting the right wrist. After surgery the patient did begin to  experience fevers the patient has still not noticed any purulent drainage coming from the right wrist incision site. The patient has been working on some gentle range of motion exercises involving her right hand. She does still have occasional sharp pain in right wrist but does feel that this is improving. The patient reports a 7 out of 10 pain score at today's visit.    Patient Stated Goals Pt would like to get rid of pain in the right hand and use it as much as possible to be independent with her daily tasks.    Currently in Pain? Yes    Pain Score 6     Pain Location Wrist    Pain Orientation Right    Pain Descriptors / Indicators Aching    Pain Type Surgical pain    Pain Onset More than a month ago    Pain Frequency Intermittent               [facility] OT Assessment - 08/28/22 2032       Assessment   Medical Diagnosis right carpal tunnel syndrome    Referring Provider (OT) Poggi,    Hand Dominance Right      Balance Screen   Has the patient fallen in the past 6 months No      Home  Environment   Lives With Daughter;Family      Prior  Function   Level of Independence Independent    Vocation Full time employment    Vocation Requirements works at [employer] [place]      ADL   ADL comments Concerned about gripping, assist with cutting meat, wearing sports bra since surgery, using left hand for most tasks.      Written Expression   Dominant Hand Right      Sensation   Light Touch Appears Intact    Additional Comments Pt reports numbness in index, middle, 1/2 ring and thumb.  Mild pins and needles.      AROM   Right Shoulder Flexion 140 Degrees   history of right shoulder impingement in 2018   Left Shoulder Flexion 140 Degrees    Right Forearm Pronation 90 Degrees    Right Forearm Supination 75 Degrees    Left Forearm Pronation 90 Degrees    Left Forearm Supination 90 Degrees    Right Wrist Extension 45 Degrees    Right Wrist Flexion 55 Degrees    Left  Wrist Extension 80 Degrees    Left Wrist Flexion 70 Degrees      Right Hand AROM   R Index  MCP 0-90 55 Degrees    R Index PIP 0-100 65 Degrees    R Index DIP 0-70 30 Degrees    R Long  MCP 0-90 60 Degrees    R Long PIP 0-100 65 Degrees    R Long DIP 0-70 45 Degrees    R Ring  MCP 0-90 55 Degrees    R Ring PIP 0-100 65 Degrees    R Ring DIP 0-70 30 Degrees    R Little  MCP 0-90 35 Degrees    R Little PIP 0-100 60 Degrees    R Little DIP 0-70 25 Degrees             LUE WNLs for ROM and strength.  Treatment: Contrast:  Contrast to right hand and wrist with written instructions as a part of home program to manage edema in right hand and wrist.  Pt to perform 2-3 time a day, ending with warm and then performing exercises.   Pt instructed on tendon gliding exercises with written instructions to perform 2-3 times a day, keeping fisting loose, no tight fisting. Pt instructed on scar massage to perform 2-3 times a day     OT Education - 08/27/22 2013     Education Details role of OT, goals, POC, contrast, exercises    Person(s) Educated Patient    Methods Explanation;Demonstration;Handout    Comprehension Verbalized understanding;Returned demonstration              OT Short Term Goals - 08/28/22 2049       OT SHORT TERM GOAL #1   Title Pt will demonstrate knowledge and application of contrast for edema control in R hand and wrist.    Baseline Eval:  no current knowledge.    Time 3    Period Weeks    Status New    Target Date 09/15/22               OT Long Term Goals - 08/28/22 2050       OT LONG TERM GOAL #1   Title Pt will demonstrate independence in home exercise program for right hand and wrist.    Baseline Eval:  no current program    Time 8    Period Weeks    Status New  Target Date 10/20/22      OT LONG TERM GOAL #2   Title Pt will demonstrate ability to don and doff regular bra with modified independence.    Baseline Eval:  wearing sports  bra, difficulty with fasteners.    Time 8    Period Weeks    Status New    Target Date 10/20/22      OT LONG TERM GOAL #3   Title Pt will demonstrate improved gripping patterns to cut meat with modified independence.    Baseline Eval:  difficulty with cutting meat.    Time 8    Period Weeks    Status New    Target Date 10/20/22      OT LONG TERM GOAL #4   Title Right wrist active range of motion and strength increased to within normal limits for patient to push and pull heavy door open with no increase symptoms    Baseline Eval:  limited wrist motion    Time 8    Period Weeks    Status New    Target Date 10/20/22      OT LONG TERM GOAL #5   Title Right digit flexion improved to WNL symptom-free for patient to hold utensils and book without increase symptoms    Baseline Eval:  decreased AROM of digits, unable to make a composite fist    Time 8    Period Weeks    Status New    Target Date 10/20/22      Long Term Additional Goals   Additional Long Term Goals Yes      OT LONG TERM GOAL #6   Title Right grip and prehension strength increased to within normal limits for patient's age to be able to turn a doorknob symptom-free    Baseline Eval: No strengthening yet - pt 2.5 wks s/p - patient's resting pain 6 /10 with scar tissue and edema /unable to make a composite fist    Time 8    Period Weeks    Status New    Target Date 10/20/22                   Plan - 08/27/22 2010     Clinical Impression Statement Pt is a 52 yo female referred to Occupational Therapy for evaluation and treatment for Carpal tunnel syndrome, right   S/P endoscopic carpal tunnel release  by Dr. Roland Rack on 08-06-2022, pt currently 2.5 weeks post surgery.  Pt presents with pain in right hand and wrist, decreased ROM, decreased strength, decreased sensation and decreased functional use for self care and IADL tasks.  Pt would benefit from skilled OT services to maximize safety and independence in necessary  daily tasks.    OT Occupational Profile and History Detailed Assessment- Review of Records and additional review of physical, cognitive, psychosocial history related to current functional performance    Occupational performance deficits (Please refer to evaluation for details): ADL's;IADL's;Leisure    Body Structure / Function / Physical Skills Coordination;Decreased knowledge of use of DME;Dexterity;Edema;Flexibility;FMC;IADL;Pain;ROM;Scar mobility;Sensation;Strength;UE functional use    Psychosocial Skills Habits;Routines and Behaviors;Environmental  Adaptations    Rehab Potential Good    Clinical Decision Making Several treatment options, min-mod task modification necessary    Comorbidities Affecting Occupational Performance: May have comorbidities impacting occupational performance    Modification or Assistance to Complete Evaluation  No modification of tasks or assist necessary to complete eval    OT Frequency 2x / week    OT Duration  6 weeks    OT Treatment/Interventions Self-care/ADL training;Fluidtherapy;Therapeutic exercise;Scar mobilization;Therapeutic activities;Cryotherapy;Moist Heat;Ultrasound;Paraffin;Contrast Bath;Neuromuscular education;DME and/or AE instruction;Manual Therapy;Passive range of motion;Splinting;Patient/family education    Consulted and Agree with Plan of Care Patient             Patient will benefit from skilled therapeutic intervention in order to improve the following deficits and impairments:   Body Structure / Function / Physical Skills: Coordination, Decreased knowledge of use of DME, Dexterity, Edema, Flexibility, FMC, IADL, Pain, ROM, Scar mobility, Sensation, Strength, UE functional use   Psychosocial Skills: Habits, Routines and Behaviors, Environmental  Adaptations   Visit Diagnosis: Muscle weakness (generalized)  Pain in right wrist  Stiffness of right wrist, not elsewhere classified  Scar condition and fibrosis of skin    Problem  List Patient Active Problem List   Diagnosis Date Noted   Carpal tunnel syndrome on right 03/10/2017   Heel spur 08/23/2015   Plantar fasciitis 08/23/2015   HAV (hallux abducto valgus) 08/23/2015   Bony exostosis 08/23/2015   Rhonda Goodman, OTR/L, CLT  Pius Byrom, OT 08/28/2022, 9:11 PM  [facility] [address] [place], [address] Phone: [phone]   Fax:  [phone]  Name: Rhonda Goodman MRN: [id number] Date of Birth: 01-17-1971

## 2022-09-01 ENCOUNTER — Ambulatory Visit: Payer: 59 | Attending: Student | Admitting: Occupational Therapy

## 2022-09-01 ENCOUNTER — Encounter: Payer: Self-pay | Admitting: Occupational Therapy

## 2022-09-01 DIAGNOSIS — M25531 Pain in right wrist: Secondary | ICD-10-CM | POA: Diagnosis not present

## 2022-09-01 DIAGNOSIS — L905 Scar conditions and fibrosis of skin: Secondary | ICD-10-CM | POA: Diagnosis not present

## 2022-09-01 DIAGNOSIS — M25631 Stiffness of right wrist, not elsewhere classified: Secondary | ICD-10-CM | POA: Diagnosis not present

## 2022-09-01 DIAGNOSIS — M6281 Muscle weakness (generalized): Secondary | ICD-10-CM | POA: Diagnosis not present

## 2022-09-04 NOTE — Therapy (Signed)
[facility] [address], [place], [address] Phone: [phone]   Fax:  [phone]  Occupational Therapy Treatment  Patient Details  Name: TRESE JAEGERS MRN: [id number] Date of Birth: Jan 22, 1971 Referring Provider (OT): Poggi,   Encounter Date: 09/01/2022   OT End of Session - 09/03/22 1349     Visit Number 2    Number of Visits 17    Date for OT Re-Evaluation 10/20/22    OT Start Time 0900    OT Stop Time 0944    OT Time Calculation (min) 44 min    Activity Tolerance Patient tolerated treatment well    Behavior During Therapy [facility] for tasks assessed/performed             Past Medical History:  Diagnosis Date   Carpal tunnel syndrome on right 03/10/2017   Pre-diabetes     Past Surgical History:  Procedure Laterality Date   CARPAL TUNNEL RELEASE Right 08/06/2022   Procedure: CARPAL TUNNEL RELEASE ENDOSCOPIC;  Surgeon: Corky Mull, MD;  Location: [facility] ORS;  Service: Orthopedics;  Laterality: Right;   CESAREAN SECTION  1989    There were no vitals filed for this visit.   Subjective Assessment - 09/03/22 1347     Subjective  Pt reports she is doing well, feels like the scar is doing well. She dropped her cica care and it got really dirty and she needs another.    Pertinent History Per chart, last MD note: Feleica Lanford is a 52 y.o. female who presents today for her first postop appointment following a right endoscopic carpal tunnel release. Surgery was performed by Dr. Roland Rack on 08/06/2022. Overall the patient feels that she is doing well. She does report numbness and tingling the right hand and underlying swelling in the right hand as well. The patient denies any falls or trauma affecting the right wrist. After surgery the patient did begin to experience fevers the patient has still not noticed any purulent drainage coming from the right wrist incision site. The patient has been working on some gentle  range of motion exercises involving her right hand. She does still have occasional sharp pain in right wrist but does feel that this is improving. The patient reports a 7 out of 10 pain score at today's visit.    Patient Stated Goals Pt would like to get rid of pain in the right hand and use it as much as possible to be independent with her daily tasks.    Currently in Pain? Yes    Pain Score 6     Pain Location Wrist    Pain Orientation Right    Pain Descriptors / Indicators Aching    Pain Type Surgical pain    Pain Onset More than a month ago    Pain Frequency Intermittent             Contrast: Pt seen for use of contrast this date for edema control and management.  11 mins total, ending with warm prior to performing manual therapy.    Manual therapy:  Following contrast, pt was seen for scar massage this date at the wrist to decrease scar tissue, increase tissue mobility, ROM and decrease risk of scar adhesion.  Manual massage to hand and stretches to carpals and metacarpals to promote lymphatic flow.  Pt reports tenderness in the palm and feels she has a knot in this area.    Therapeutic Exercises:  Pt reports pain  in the palm near wrist with movement.  ROM of wrist, flex to 68 degrees, extension to 64 degrees (both significantly improved since eval).   Opposition of thumb to small finger with slight pull and pain 2/10.   Tendon gliding exercises with cues for AROM Nerve glide instruction , 5 reps.   Issued new piece of CICA care and a couple of sleeves to wear.     Pt to continue with use of contrast at home, self directed scar massage, use of CICA care at night.  Brace with heavier activities. ROM and nerve glides for home program.       OT Education - 09/04/22 1348     Education Details role of OT, goals, POC, contrast, exercises    Person(s) Educated Patient    Methods Explanation;Demonstration;Handout    Comprehension Verbalized understanding;Returned demonstration               OT Short Term Goals - 08/28/22 2049       OT SHORT TERM GOAL #1   Title Pt will demonstrate knowledge and application of contrast for edema control in R hand and wrist.    Baseline Eval:  no current knowledge.    Time 3    Period Weeks    Status New    Target Date 09/15/22               OT Long Term Goals - 08/28/22 2050       OT LONG TERM GOAL #1   Title Pt will demonstrate independence in home exercise program for right hand and wrist.    Baseline Eval:  no current program    Time 8    Period Weeks    Status New    Target Date 10/20/22      OT LONG TERM GOAL #2   Title Pt will demonstrate ability to don and doff regular bra with modified independence.    Baseline Eval:  wearing sports bra, difficulty with fasteners.    Time 8    Period Weeks    Status New    Target Date 10/20/22      OT LONG TERM GOAL #3   Title Pt will demonstrate improved gripping patterns to cut meat with modified independence.    Baseline Eval:  difficulty with cutting meat.    Time 8    Period Weeks    Status New    Target Date 10/20/22      OT LONG TERM GOAL #4   Title Right wrist active range of motion and strength increased to within normal limits for patient to push and pull heavy door open with no increase symptoms    Baseline Eval:  limited wrist motion    Time 8    Period Weeks    Status New    Target Date 10/20/22      OT LONG TERM GOAL #5   Title Right digit flexion improved to WNL symptom-free for patient to hold utensils and book without increase symptoms    Baseline Eval:  decreased AROM of digits, unable to make a composite fist    Time 8    Period Weeks    Status New    Target Date 10/20/22      Long Term Additional Goals   Additional Long Term Goals Yes      OT LONG TERM GOAL #6   Title Right grip and prehension strength increased to within normal limits for patient's age to be able  to turn a doorknob symptom-free    Baseline Eval: No  strengthening yet - pt 2.5 wks s/p - patient's resting pain 6 /10 with scar tissue and edema /unable to make a composite fist    Time 8    Period Weeks    Status New    Target Date 10/20/22                   Plan - 09/03/22 1408     Clinical Impression Statement Pt is a 52 yo female referred to Occupational Therapy for evaluation and treatment for Carpal tunnel syndrome, right   S/P endoscopic carpal tunnel release  by Dr. Roland Rack on 08-06-2022, pt currently 4 weeks post surgery.  Pt presents with pain in right hand and wrist, decreased ROM, decreased strength, decreased sensation and decreased functional use for self care and IADL tasks. Edema decreased in hand and wrist with use of contrast over the last week, pain present in the palm this date with exercises.  AROM of the wrist significantly improved this date compared to evaluation.  Issued new CICA care for night time for scar management.  Continue to work towards goals in plan of care.  Pt would benefit from skilled OT services to maximize safety and independence in necessary daily tasks.    OT Occupational Profile and History Detailed Assessment- Review of Records and additional review of physical, cognitive, psychosocial history related to current functional performance    Occupational performance deficits (Please refer to evaluation for details): ADL's;IADL's;Leisure    Body Structure / Function / Physical Skills Coordination;Decreased knowledge of use of DME;Dexterity;Edema;Flexibility;FMC;IADL;Pain;ROM;Scar mobility;Sensation;Strength;UE functional use    Psychosocial Skills Habits;Routines and Behaviors;Environmental  Adaptations    Rehab Potential Good    Clinical Decision Making Several treatment options, min-mod task modification necessary    Comorbidities Affecting Occupational Performance: May have comorbidities impacting occupational performance    Modification or Assistance to Complete Evaluation  No modification of tasks or  assist necessary to complete eval    OT Frequency 2x / week    OT Duration 6 weeks    OT Treatment/Interventions Self-care/ADL training;Fluidtherapy;Therapeutic exercise;Scar mobilization;Therapeutic activities;Cryotherapy;Moist Heat;Ultrasound;Paraffin;Contrast Bath;Neuromuscular education;DME and/or AE instruction;Manual Therapy;Passive range of motion;Splinting;Patient/family education    Consulted and Agree with Plan of Care Patient             Patient will benefit from skilled therapeutic intervention in order to improve the following deficits and impairments:   Body Structure / Function / Physical Skills: Coordination, Decreased knowledge of use of DME, Dexterity, Edema, Flexibility, FMC, IADL, Pain, ROM, Scar mobility, Sensation, Strength, UE functional use   Psychosocial Skills: Habits, Routines and Behaviors, Environmental  Adaptations   Visit Diagnosis: Muscle weakness (generalized)  Pain in right wrist  Stiffness of right wrist, not elsewhere classified  Scar condition and fibrosis of skin    Problem List Patient Active Problem List   Diagnosis Date Noted   Carpal tunnel syndrome on right 03/10/2017   Heel spur 08/23/2015   Plantar fasciitis 08/23/2015   HAV (hallux abducto valgus) 08/23/2015   Bony exostosis 08/23/2015   Kavion Mancinas T Aleka Twitty, OTR/L, CLT  Nichael Ehly, OT 09/04/2022, 2:15 PM  [facility] [address], [place], [address] Phone: [phone]   Fax:  [phone]  Name: JISELLA HERDA MRN: [id number] Date of Birth: [birth year]

## 2022-09-05 ENCOUNTER — Ambulatory Visit: Payer: 59 | Admitting: Occupational Therapy

## 2022-09-05 ENCOUNTER — Encounter: Payer: Self-pay | Admitting: Occupational Therapy

## 2022-09-05 DIAGNOSIS — M25631 Stiffness of right wrist, not elsewhere classified: Secondary | ICD-10-CM

## 2022-09-05 DIAGNOSIS — M6281 Muscle weakness (generalized): Secondary | ICD-10-CM

## 2022-09-05 DIAGNOSIS — M25531 Pain in right wrist: Secondary | ICD-10-CM

## 2022-09-05 DIAGNOSIS — L905 Scar conditions and fibrosis of skin: Secondary | ICD-10-CM

## 2022-09-05 NOTE — Therapy (Signed)
[facility] [address] [place], [address] Phone: [phone]   Fax:  [phone]  Occupational Therapy Treatment  Patient Details  Name: Rhonda Goodman MRN: [id number] Date of Birth: 26-Nov-1970 Referring Provider (OT): Poggi,   Encounter Date: 09/05/2022   OT End of Session - 09/05/22 1022     Visit Number 3    Number of Visits 17    Date for OT Re-Evaluation 10/20/22    OT Start Time 1005    OT Stop Time 1050    OT Time Calculation (min) 45 min    Activity Tolerance Patient tolerated treatment well    Behavior During Therapy [facility] for tasks assessed/performed             Past Medical History:  Diagnosis Date   Carpal tunnel syndrome on right 03/10/2017   Pre-diabetes     Past Surgical History:  Procedure Laterality Date   CARPAL TUNNEL RELEASE Right 08/06/2022   Procedure: CARPAL TUNNEL RELEASE ENDOSCOPIC;  Surgeon: Christena Flake, MD;  Location: [facility] ORS;  Service: Orthopedics;  Laterality: Right;   CESAREAN SECTION  1989    There were no vitals filed for this visit.   Subjective Assessment - 09/05/22 1018     Subjective  Still having pain in my palm and wrist - cannot make fist and also when moving the thumb - I have this knot in palm - still numbness but improving - what do you think of my scar    Pertinent History Per chart, last MD note: Rhonda Goodman is a 52 y.o. female who presents today for her first postop appointment following a right endoscopic carpal tunnel release. Surgery was performed by Dr. Joice Lofts on 08/06/2022. Overall the patient feels that she is doing well. She does report numbness and tingling the right hand and underlying swelling in the right hand as well. The patient denies any falls or trauma affecting the right wrist. After surgery the patient did begin to experience fevers the patient has still not noticed any purulent drainage coming from the right wrist incision site. The  patient has been working on some gentle range of motion exercises involving her right hand. She does still have occasional sharp pain in right wrist but does feel that this is improving. The patient reports a 7 out of 10 pain score at today's visit.    Patient Stated Goals Pt would like to get rid of pain in the right hand and use it as much as possible to be independent with her daily tasks.    Currently in Pain? Yes    Pain Score 5     Pain Location Hand    Pain Orientation Right    Pain Descriptors / Indicators Aching               Patient arrive with reports of continue pain in the palm with digit flexion as well as thumb opposition and flexion. Tenderness over the thumb webspace as well as proximal palm, scar and volar wrist. Scar tissue still adhere. Patient was educated in scar management, scar massage as well as soft tissue. Done metacarpal spreads as well as webspace soft tissue massage-patient to have family do at home 10 reps pain-free Use Cica -Care scar pad for soft tissue massage over the proximal palm as well as scar.  Patient tolerating much better patient can use Cica -Care scar pad during soft tissue massage this week. Issued new Cica -Care  scar pad for nighttime use. Patient also fitted with Isotoner glove to wear instead of the brace during the day except with heavy activities and nighttime. Patient to do active assisted range of motion for thumb palmar radial abduction pain-free 10 reps Reinforced with patient again to focus on motion instead of any resistance or strength with tendon glides keeping pain under 3/10 with composite fist to 2 cm foam block with extension in between tolerating much better 10 reps each Gentle pressure stretch for composite extension added 10 reps tolerating well. Opposition patient done better with picking up 2 cm foam block alternating digits 8 reps Patient can do home exercises 3 times a day. Tolerated changes in home program very  well.           OT Treatments/Exercises (OP) - 09/05/22 0001       RUE Contrast Bath   Time 8 minutes    Comments prior to soft tissue nad ROM                    OT Education - 09/05/22 1237     Person(s) Educated Patient    Methods Explanation;Demonstration;Handout    Comprehension Verbalized understanding;Returned demonstration              OT Short Term Goals - 08/28/22 2049       OT SHORT TERM GOAL #1   Title Pt will demonstrate knowledge and application of contrast for edema control in R hand and wrist.    Baseline Eval:  no current knowledge.    Time 3    Period Weeks    Status New    Target Date 09/15/22               OT Long Term Goals - 08/28/22 2050       OT LONG TERM GOAL #1   Title Pt will demonstrate independence in home exercise program for right hand and wrist.    Baseline Eval:  no current program    Time 8    Period Weeks    Status New    Target Date 10/20/22      OT LONG TERM GOAL #2   Title Pt will demonstrate ability to don and doff regular bra with modified independence.    Baseline Eval:  wearing sports bra, difficulty with fasteners.    Time 8    Period Weeks    Status New    Target Date 10/20/22      OT LONG TERM GOAL #3   Title Pt will demonstrate improved gripping patterns to cut meat with modified independence.    Baseline Eval:  difficulty with cutting meat.    Time 8    Period Weeks    Status New    Target Date 10/20/22      OT LONG TERM GOAL #4   Title Right wrist active range of motion and strength increased to within normal limits for patient to push and pull heavy door open with no increase symptoms    Baseline Eval:  limited wrist motion    Time 8    Period Weeks    Status New    Target Date 10/20/22      OT LONG TERM GOAL #5   Title Right digit flexion improved to WNL symptom-free for patient to hold utensils and book without increase symptoms    Baseline Eval:  decreased AROM of digits,  unable to make a composite fist    Time  8    Period Weeks    Status New    Target Date 10/20/22      Long Term Additional Goals   Additional Long Term Goals Yes      OT LONG TERM GOAL #6   Title Right grip and prehension strength increased to within normal limits for patient's age to be able to turn a doorknob symptom-free    Baseline Eval: No strengthening yet - pt 2.5 wks s/p - patient's resting pain 6 /10 with scar tissue and edema /unable to make a composite fist    Time 8    Period Weeks    Status New    Target Date 10/20/22                   Plan - 09/05/22 1022     Clinical Impression Statement Pt is a 52 yo female referred to Occupational Therapy for evaluation and treatment for Carpal tunnel syndrome, right   S/P endoscopic carpal tunnel release  by Dr. Joice Lofts on 08-06-2022. Pt presents at eval with pain in right hand and wrist, decreased ROM, decreased strength, decreased sensation and decreased functional use for self care and IADL tasks. Edema decreased in hand and wrist with use of contrast over the last week, pain cont to be present in the palm  and with exercises.  AROM of the wrist improve but still limited in digits and thumb.  Issued new CICA care for night time and for scar massage. Cont to work towards goals in plan of care.  Pt would benefit from skilled OT services to maximize safety and independence in necessary daily tasks.    OT Occupational Profile and History Detailed Assessment- Review of Records and additional review of physical, cognitive, psychosocial history related to current functional performance    Occupational performance deficits (Please refer to evaluation for details): ADL's;IADL's;Leisure    Body Structure / Function / Physical Skills Coordination;Decreased knowledge of use of DME;Dexterity;Edema;Flexibility;FMC;IADL;Pain;ROM;Scar mobility;Sensation;Strength;UE functional use    Psychosocial Skills Habits;Routines and Behaviors;Environmental   Adaptations    Rehab Potential Good    Clinical Decision Making Several treatment options, min-mod task modification necessary    Comorbidities Affecting Occupational Performance: May have comorbidities impacting occupational performance    Modification or Assistance to Complete Evaluation  No modification of tasks or assist necessary to complete eval    OT Frequency 2x / week    OT Duration 6 weeks    OT Treatment/Interventions Self-care/ADL training;Fluidtherapy;Therapeutic exercise;Scar mobilization;Therapeutic activities;Cryotherapy;Moist Heat;Ultrasound;Paraffin;Contrast Bath;Neuromuscular education;DME and/or AE instruction;Manual Therapy;Passive range of motion;Splinting;Patient/family education    Consulted and Agree with Plan of Care Patient             Patient will benefit from skilled therapeutic intervention in order to improve the following deficits and impairments:   Body Structure / Function / Physical Skills: Coordination, Decreased knowledge of use of DME, Dexterity, Edema, Flexibility, FMC, IADL, Pain, ROM, Scar mobility, Sensation, Strength, UE functional use   Psychosocial Skills: Habits, Routines and Behaviors, Environmental  Adaptations   Visit Diagnosis: Muscle weakness (generalized)  Pain in right wrist  Scar condition and fibrosis of skin  Stiffness of right wrist, not elsewhere classified    Problem List Patient Active Problem List   Diagnosis Date Noted   Carpal tunnel syndrome on right 03/10/2017   Heel spur 08/23/2015   Plantar fasciitis 08/23/2015   HAV (hallux abducto valgus) 08/23/2015   Bony exostosis 08/23/2015    Oletta Cohn, OTR/L,CLT 09/05/2022, 12:40 PM  [facility] [address] [place], [place], [address] Phone: [phone]   Fax:  [phone]  Name: Rhonda Goodman MRN: [id number] Date of Birth: [birth year]

## 2022-09-09 ENCOUNTER — Ambulatory Visit: Payer: 59 | Admitting: Occupational Therapy

## 2022-09-09 DIAGNOSIS — M25631 Stiffness of right wrist, not elsewhere classified: Secondary | ICD-10-CM | POA: Diagnosis not present

## 2022-09-09 DIAGNOSIS — L905 Scar conditions and fibrosis of skin: Secondary | ICD-10-CM

## 2022-09-09 DIAGNOSIS — M25531 Pain in right wrist: Secondary | ICD-10-CM | POA: Diagnosis not present

## 2022-09-09 DIAGNOSIS — M6281 Muscle weakness (generalized): Secondary | ICD-10-CM

## 2022-09-09 NOTE — Therapy (Signed)
[facility] [address] [place], [place], [address] Phone: [phone]   Fax:  [phone]  Occupational Therapy Treatment  Patient Details  Name: Rhonda Goodman MRN: [id number] Date of Birth: 09-Sep-1970 Referring Provider (OT): Poggi,   Encounter Date: 09/09/2022   OT End of Session - 09/09/22 0844     Visit Number 4    Number of Visits 17    Date for OT Re-Evaluation 10/20/22    OT Start Time 0819    OT Stop Time 0904    OT Time Calculation (min) 45 min    Activity Tolerance Patient tolerated treatment well    Behavior During Therapy [facility] for tasks assessed/performed             Past Medical History:  Diagnosis Date   Carpal tunnel syndrome on right 03/10/2017   Pre-diabetes     Past Surgical History:  Procedure Laterality Date   CARPAL TUNNEL RELEASE Right 08/06/2022   Procedure: CARPAL TUNNEL RELEASE ENDOSCOPIC;  Surgeon: Christena FlakePoggi, John J, MD;  Location: [facility] ORS;  Service: Orthopedics;  Laterality: Right;   CESAREAN SECTION  1989    There were no vitals filed for this visit.   Subjective Assessment - 09/09/22 0837     Subjective  Most of my pain in my thumb and palm where that knot is - pain with tight fist , thumb out to the side and pinching - squeezing still hurts    Pertinent History Per chart, last MD note: Rhonda Boslissa Oshea is a 52 y.o. female who presents today for her first postop appointment following a right endoscopic carpal tunnel release. Surgery was performed by Dr. Joice LoftsPoggi on 08/06/2022. Overall the patient feels that she is doing well. She does report numbness and tingling the right hand and underlying swelling in the right hand as well. The patient denies any falls or trauma affecting the right wrist. After surgery the patient did begin to experience fevers the patient has still not noticed any purulent drainage coming from the right wrist incision site. The patient has been working on some gentle  range of motion exercises involving her right hand. She does still have occasional sharp pain in right wrist but does feel that this is improving. The patient reports a 7 out of 10 pain score at today's visit.    Patient Stated Goals Pt would like to get rid of pain in the right hand and use it as much as possible to be independent with her daily tasks.    Currently in Pain? Yes    Pain Score 5     Pain Location Hand    Pain Orientation Right    Pain Descriptors / Indicators Aching    Pain Type Surgical pain    Pain Onset More than a month ago    Pain Frequency Intermittent                [facility] OT Assessment - 09/09/22 0001       AROM   Right Wrist Extension 55 Degrees   in session 65   Right Wrist Flexion 55 Degrees   in session 80     Right Hand AROM   R Thumb Opposition to Index --   opposition to 5th pain free in session   R Index  MCP 0-90 85 Degrees    R Index PIP 0-100 90 Degrees    R Long  MCP 0-90 85 Degrees    R  Long PIP 0-100 90 Degrees    R Ring  MCP 0-90 80 Degrees    R Ring PIP 0-100 90 Degrees    R Little  MCP 0-90 80 Degrees    R Little PIP 0-100 85 Degrees               Patient arrived this date with increased digit flexion as well as wrist compared to evaluation.  Patient limited mostly with pain and tenderness over thenar eminence as well as volar scar. Also with resistance for gripping composite and pinching. Remind patient she is 4 weeks postop and had symptoms for a long time prior to surgery.     OT Treatments/Exercises (OP) - 09/09/22 0001       RUE Fluidotherapy   Number Minutes Fluidotherapy 8 Minutes    RUE Fluidotherapy Location Hand;Wrist    Comments AROM for wrist and digits- with ice rotation x 2- 1 min            Patient arrive with reports of continue pain in the palm with digit flexion as well as thumb opposition and flexion. Tenderness over the thumb webspace less but mostly over thenar eminence  Scar adhere limiting  composite thumb flexion and wrist flexion extension.   Educated patient on anatomy of carpal tunnel postsurgery. Focus on scar massage using Coban for increased traction and initiate for patient to use at home with some flexion and extension combination with massage  Done metacarpal spreads as well as webspace soft tissue massage-patient to have family do at home 10 reps pain-free Continue Cica-Care for nighttime  Patient to continue with Isotoner glove to wear instead of the brace during the day except with heavy activities and nighttime. Patient to do active assisted range of motion for thumb palmar radial abduction pain-free 10 reps Thumb IP and MC flexion prior to active assisted range of motion for composite thumb flexion prior to opposition Improved greatly for opposition to all digits pain-free Done gentle passive range of motion to second fold of fourth and fifth  Reinforced with patient again to focus on motion instead of any resistance or strength with tendon glides keeping pain under 3/10 with composite fist to finger out of palm with extension in between tolerating much better 10 reps each Reviewed again prayer stretch for composite extension 10 reps pain-free   Patient can do home exercises 3 times a day. Tolerated changes in home program very well.            OT Education - 09/09/22 0844     Education Details progress and changes of HEP    Person(s) Educated Patient    Methods Explanation;Demonstration;Handout    Comprehension Verbalized understanding;Returned demonstration              OT Short Term Goals - 08/28/22 2049       OT SHORT TERM GOAL #1   Title Pt will demonstrate knowledge and application of contrast for edema control in R hand and wrist.    Baseline Eval:  no current knowledge.    Time 3    Period Weeks    Status New    Target Date 09/15/22               OT Long Term Goals - 08/28/22 2050       OT LONG TERM GOAL #1   Title Pt  will demonstrate independence in home exercise program for right hand and wrist.    Baseline Eval:  no current program    Time 8    Period Weeks    Status New    Target Date 10/20/22      OT LONG TERM GOAL #2   Title Pt will demonstrate ability to don and doff regular bra with modified independence.    Baseline Eval:  wearing sports bra, difficulty with fasteners.    Time 8    Period Weeks    Status New    Target Date 10/20/22      OT LONG TERM GOAL #3   Title Pt will demonstrate improved gripping patterns to cut meat with modified independence.    Baseline Eval:  difficulty with cutting meat.    Time 8    Period Weeks    Status New    Target Date 10/20/22      OT LONG TERM GOAL #4   Title Right wrist active range of motion and strength increased to within normal limits for patient to push and pull heavy door open with no increase symptoms    Baseline Eval:  limited wrist motion    Time 8    Period Weeks    Status New    Target Date 10/20/22      OT LONG TERM GOAL #5   Title Right digit flexion improved to WNL symptom-free for patient to hold utensils and book without increase symptoms    Baseline Eval:  decreased AROM of digits, unable to make a composite fist    Time 8    Period Weeks    Status New    Target Date 10/20/22      Long Term Additional Goals   Additional Long Term Goals Yes      OT LONG TERM GOAL #6   Title Right grip and prehension strength increased to within normal limits for patient's age to be able to turn a doorknob symptom-free    Baseline Eval: No strengthening yet - pt 2.5 wks s/p - patient's resting pain 6 /10 with scar tissue and edema /unable to make a composite fist    Time 8    Period Weeks    Status New    Target Date 10/20/22                   Plan - 09/09/22 0845     Clinical Impression Statement Pt is a 52 yo female referred to Occupational Therapy for evaluation and treatment for Carpal tunnel syndrome, right   S/P  endoscopic carpal tunnel release  by Dr. Joice Lofts on 08-06-2022. Pt presents at eval with pain in right hand and wrist, decreased ROM, decreased strength, decreased sensation and decreased functional use for self care and IADL tasks. Edema decreased in hand and wrist with use of contrast over the last week , decrease pain and increase ROM in digits and wrist. Pain cont moslty in thenar eminence , thumb flexion and RA - and resistance. Focus this date at scar massage, soft tissue and thumb composite flexion and RA - great progress - pt about 4wks s/p - Cont  CICA care for night time and for scar massage. Cont to work towards goals in plan of care.  Pt would benefit from skilled OT services to maximize safety and independence in necessary daily tasks.    OT Occupational Profile and History Detailed Assessment- Review of Records and additional review of physical, cognitive, psychosocial history related to current functional performance    Occupational performance deficits (Please refer to evaluation for  details): ADL's;IADL's;Leisure    Body Structure / Function / Physical Skills Coordination;Decreased knowledge of use of DME;Dexterity;Edema;Flexibility;FMC;IADL;Pain;ROM;Scar mobility;Sensation;Strength;UE functional use    Psychosocial Skills Habits;Routines and Behaviors;Environmental  Adaptations    Rehab Potential Good    Clinical Decision Making Several treatment options, min-mod task modification necessary    Comorbidities Affecting Occupational Performance: May have comorbidities impacting occupational performance    Modification or Assistance to Complete Evaluation  No modification of tasks or assist necessary to complete eval    OT Frequency 2x / week    OT Duration 6 weeks    OT Treatment/Interventions Self-care/ADL training;Fluidtherapy;Therapeutic exercise;Scar mobilization;Therapeutic activities;Cryotherapy;Moist Heat;Ultrasound;Paraffin;Contrast Bath;Neuromuscular education;DME and/or AE  instruction;Manual Therapy;Passive range of motion;Splinting;Patient/family education    Consulted and Agree with Plan of Care Patient             Patient will benefit from skilled therapeutic intervention in order to improve the following deficits and impairments:   Body Structure / Function / Physical Skills: Coordination, Decreased knowledge of use of DME, Dexterity, Edema, Flexibility, FMC, IADL, Pain, ROM, Scar mobility, Sensation, Strength, UE functional use   Psychosocial Skills: Habits, Routines and Behaviors, Environmental  Adaptations   Visit Diagnosis: Muscle weakness (generalized)  Scar condition and fibrosis of skin  Stiffness of right wrist, not elsewhere classified  Pain in right wrist    Problem List Patient Active Problem List   Diagnosis Date Noted   Carpal tunnel syndrome on right 03/10/2017   Heel spur 08/23/2015   Plantar fasciitis 08/23/2015   HAV (hallux abducto valgus) 08/23/2015   Bony exostosis 08/23/2015    Rhonda Goodman, Rhonda Goodman,Rhonda Goodman 09/09/2022, 11:45 AM  [facility] [address] [place], [address] Phone: [phone]   Fax:  [phone]  Name: Rhonda Goodman MRN: [id number] Date of Birth: [birth year]

## 2022-09-11 ENCOUNTER — Ambulatory Visit: Payer: 59 | Admitting: Occupational Therapy

## 2022-09-15 ENCOUNTER — Ambulatory Visit: Payer: 59 | Admitting: Occupational Therapy

## 2022-09-19 ENCOUNTER — Encounter: Payer: Self-pay | Admitting: Occupational Therapy

## 2022-09-19 ENCOUNTER — Ambulatory Visit: Payer: 59 | Admitting: Occupational Therapy

## 2022-09-19 DIAGNOSIS — M25631 Stiffness of right wrist, not elsewhere classified: Secondary | ICD-10-CM | POA: Diagnosis not present

## 2022-09-19 DIAGNOSIS — M25531 Pain in right wrist: Secondary | ICD-10-CM

## 2022-09-19 DIAGNOSIS — L905 Scar conditions and fibrosis of skin: Secondary | ICD-10-CM | POA: Diagnosis not present

## 2022-09-19 DIAGNOSIS — M6281 Muscle weakness (generalized): Secondary | ICD-10-CM | POA: Diagnosis not present

## 2022-09-19 NOTE — Therapy (Signed)
[facility] [address] [place], [address] Phone: [phone]   Fax:  [phone]  Occupational Therapy Treatment  Patient Details  Name: Rhonda Goodman MRN: [id number] Date of Birth: [birth year] Referring Provider (OT): Poggi,   Encounter Date: 09/19/2022   OT End of Session - 09/19/22 1118     Visit Number 5    Number of Visits 17    Date for OT Re-Evaluation 10/20/22    OT Start Time 1005    OT Stop Time 1100    OT Time Calculation (min) 55 min    Activity Tolerance Patient tolerated treatment well    Behavior During Therapy [facility] for tasks assessed/performed             Past Medical History:  Diagnosis Date   Carpal tunnel syndrome on right 03/10/2017   Pre-diabetes     Past Surgical History:  Procedure Laterality Date   CARPAL TUNNEL RELEASE Right 08/06/2022   Procedure: CARPAL TUNNEL RELEASE ENDOSCOPIC;  Surgeon: Christena Flake, MD;  Location: [facility] ORS;  Service: Orthopedics;  Laterality: Right;   CESAREAN SECTION  1989    There were no vitals filed for this visit.   Subjective Assessment - 09/19/22 1116     Subjective  Doing better.  Still pain with gripping, pulling and twisting.  And then pain into my thumb when I pinch or grip.  The index and middle finger feels just swollen and stiff.    Pertinent History Per chart, last MD note: Rhonda Goodman is a 52 y.o. female who presents today for her first postop appointment following a right endoscopic carpal tunnel release. Surgery was performed by Dr. Joice Lofts on 08/06/2022. Overall the patient feels that she is doing well. She does report numbness and tingling the right hand and underlying swelling in the right hand as well. The patient denies any falls or trauma affecting the right wrist. After surgery the patient did begin to experience fevers the patient has still not noticed any purulent drainage coming from the right wrist incision site. The  patient has been working on some gentle range of motion exercises involving her right hand. She does still have occasional sharp pain in right wrist but does feel that this is improving. The patient reports a 7 out of 10 pain score at today's visit.    Patient Stated Goals Pt would like to get rid of pain in the right hand and use it as much as possible to be independent with her daily tasks.    Currently in Pain? Yes    Pain Score 2     Pain Location Wrist    Pain Orientation Right    Pain Descriptors / Indicators Tightness;Sore;Tender    Pain Type Surgical pain    Pain Onset More than a month ago    Pain Frequency Intermittent                [facility] OT Assessment - 09/19/22 0001       AROM   Right Forearm Pronation 90 Degrees    Right Forearm Supination 90 Degrees    Right Wrist Extension 70 Degrees    Right Wrist Flexion 80 Degrees   pain     Strength   Right Hand Grip (lbs) 20   pain   Right Hand Lateral Pinch 15 lbs    Right Hand 3 Point Pinch 13 lbs    Left Hand Grip (lbs) 70  Left Hand Lateral Pinch 18 lbs    Left Hand 3 Point Pinch 18 lbs              Patient arrived with reports of increased active range of motion with decreased pain as well as increased functional use. Pain mostly with pinching and gripping. Assess patient's functional use by pushing and pulling door open and close as well as turning doorknobs no issues.  Had patient carry and pick up weights was able to do 4 to 5 pounds without increase symptoms. Writing as well as lifting pocketbook still issues. Patient to enlarge grip on pen if returning back to work -had more ease and less discomfort. Can hold coffee mug Was able to do buttons      OT Treatments/Exercises (OP) - 09/19/22 0001       RUE Fluidotherapy   RUE Fluidotherapy Location Hand;Wrist    Comments AROM for digits and wrist - decrease pain and edema             Scar adhere limiting composite thumb flexion/opposition  and wrist flexion extension.   Educated patient on anatomy of carpal tunnel postsurgery. Focus on scar massage using Coban and mini massager this date with great success - for increased traction and initiate for patient to use at home    Done metacarpal spreads as well as webspace soft tissue massage-patient to have family do at home 10 reps pain-free Continue [employer] for nighttime - Issue   Patient to continue with Isotoner glove after doing contrast to decrease edema in second and third digits   patient to do active assisted range of motion for thumb palmar radial abduction pain-free 10 reps Thumb IP and MC flexion prior to active assisted range of motion for composite thumb flexion prior to opposition Improved greatly for opposition to all digits pain-free   Reinforced with patient again to focus on motion instead of any resistance or strength with tendon glides keeping pain under 3/10 with composite fist  Reviewed again prayer stretch for composite extension 10 reps pain-free -was little irritated over ulnar hand pisiform  Improved in session   Patient can do home exercises 3 times a day. Tolerated changes in home program very well.              OT Education - 09/19/22 1118     Education Details progress and changes of HEP    Person(s) Educated Patient    Methods Explanation;Demonstration;Handout    Comprehension Verbalized understanding;Returned demonstration              OT Short Term Goals - 08/28/22 2049       OT SHORT TERM GOAL #1   Title Pt will demonstrate knowledge and application of contrast for edema control in R hand and wrist.    Baseline Eval:  no current knowledge.    Time 3    Period Weeks    Status New    Target Date 09/15/22               OT Long Term Goals - 08/28/22 2050       OT LONG TERM GOAL #1   Title Pt will demonstrate independence in home exercise program for right hand and wrist.    Baseline Eval:  no current program     Time 8    Period Weeks    Status New    Target Date 10/20/22      OT LONG TERM GOAL #2  Title Pt will demonstrate ability to don and doff regular bra with modified independence.    Baseline Eval:  wearing sports bra, difficulty with fasteners.    Time 8    Period Weeks    Status New    Target Date 10/20/22      OT LONG TERM GOAL #3   Title Pt will demonstrate improved gripping patterns to cut meat with modified independence.    Baseline Eval:  difficulty with cutting meat.    Time 8    Period Weeks    Status New    Target Date 10/20/22      OT LONG TERM GOAL #4   Title Right wrist active range of motion and strength increased to within normal limits for patient to push and pull heavy door open with no increase symptoms    Baseline Eval:  limited wrist motion    Time 8    Period Weeks    Status New    Target Date 10/20/22      OT LONG TERM GOAL #5   Title Right digit flexion improved to WNL symptom-free for patient to hold utensils and book without increase symptoms    Baseline Eval:  decreased AROM of digits, unable to make a composite fist    Time 8    Period Weeks    Status New    Target Date 10/20/22      Long Term Additional Goals   Additional Long Term Goals Yes      OT LONG TERM GOAL #6   Title Right grip and prehension strength increased to within normal limits for patient's age to be able to turn a doorknob symptom-free    Baseline Eval: No strengthening yet - pt 2.5 wks s/p - patient's resting pain 6 /10 with scar tissue and edema /unable to make a composite fist    Time 8    Period Weeks    Status New    Target Date 10/20/22                   Plan - 09/19/22 1118     Clinical Impression Statement Pt is a 52 yo female referred to Occupational Therapy for evaluation and treatment for Carpal tunnel syndrome, right   S/P endoscopic carpal tunnel release  by Dr. Joice Lofts on 08-06-2022. Pt presents at eval with pain in right hand and wrist, decreased  ROM, decreased strength, decreased sensation and decreased functional use for self care and IADL tasks.  Pt made great progress the last 2 wks in edema, pain and scar tissue with increase AROM. Pt this date able to push, pull door and turn doorknob without pain - carry 4-5 lbs without pain. Pt still pain with grip and 3 point pinch as well as decrease strength. Focus on scar massage, soft tissue to decrease pain and increase strength to return to prior level of function. Increase edema in 2nd and 3rd digit- pt to use of contrast  and isotoner glove the next few days. - Cont  [facility] care for night time.. Cont to work towards goals in plan of care.  Pt can if possible return to work but light duty - enlarge her pen grip to decrease pain and increase ease. Pt would benefit from skilled OT services to maximize safety and independence in necessary daily tasks.    OT Occupational Profile and History Detailed Assessment- Review of Records and additional review of physical, cognitive, psychosocial history related to current functional performance  Occupational performance deficits (Please refer to evaluation for details): ADL's;IADL's;Leisure    Body Structure / Function / Physical Skills Coordination;Decreased knowledge of use of DME;Dexterity;Edema;Flexibility;FMC;IADL;Pain;ROM;Scar mobility;Sensation;Strength;UE functional use    Psychosocial Skills Habits;Routines and Behaviors;Environmental  Adaptations    Rehab Potential Good    Clinical Decision Making Several treatment options, min-mod task modification necessary    Comorbidities Affecting Occupational Performance: May have comorbidities impacting occupational performance    Modification or Assistance to Complete Evaluation  No modification of tasks or assist necessary to complete eval    OT Frequency 2x / week    OT Duration 4 weeks    OT Treatment/Interventions Self-care/ADL training;Fluidtherapy;Therapeutic exercise;Scar mobilization;Therapeutic  activities;Cryotherapy;Moist Heat;Ultrasound;Paraffin;Contrast Bath;Neuromuscular education;DME and/or AE instruction;Manual Therapy;Passive range of motion;Splinting;Patient/family education    Consulted and Agree with Plan of Care Patient             Patient will benefit from skilled therapeutic intervention in order to improve the following deficits and impairments:   Body Structure / Function / Physical Skills: Coordination, Decreased knowledge of use of DME, Dexterity, Edema, Flexibility, FMC, IADL, Pain, ROM, Scar mobility, Sensation, Strength, UE functional use   Psychosocial Skills: Habits, Routines and Behaviors, Environmental  Adaptations   Visit Diagnosis: Muscle weakness (generalized)  Scar condition and fibrosis of skin  Pain in right wrist    Problem List Patient Active Problem List   Diagnosis Date Noted   Carpal tunnel syndrome on right 03/10/2017   Heel spur 08/23/2015   Plantar fasciitis 08/23/2015   HAV (hallux abducto valgus) 08/23/2015   Bony exostosis 08/23/2015    Oletta Cohn, OTR/L,CLT 09/19/2022, 11:24 AM  [facility] [address], [place], [address] Phone: [phone]   Fax:  [phone]  Name: Rhonda Goodman MRN: [id number] Date of Birth: 11-14-1970

## 2022-09-22 ENCOUNTER — Ambulatory Visit: Payer: 59 | Admitting: Occupational Therapy

## 2022-09-24 ENCOUNTER — Ambulatory Visit: Payer: 59 | Admitting: Occupational Therapy

## 2022-09-25 ENCOUNTER — Ambulatory Visit: Payer: 59 | Admitting: Occupational Therapy

## 2022-09-25 DIAGNOSIS — L905 Scar conditions and fibrosis of skin: Secondary | ICD-10-CM

## 2022-09-25 DIAGNOSIS — M25531 Pain in right wrist: Secondary | ICD-10-CM | POA: Diagnosis not present

## 2022-09-25 DIAGNOSIS — M6281 Muscle weakness (generalized): Secondary | ICD-10-CM | POA: Diagnosis not present

## 2022-09-25 DIAGNOSIS — M25631 Stiffness of right wrist, not elsewhere classified: Secondary | ICD-10-CM | POA: Diagnosis not present

## 2022-09-25 NOTE — Therapy (Signed)
[facility] [address] [facility] [address] [place], [address] Phone: [phone]   Fax:  [phone]  Occupational Therapy Treatment  Patient Details  Name: Rhonda Goodman MRN: [id number] Date of Birth: 27-Jan-1971 Referring Provider (OT): Poggi,   Encounter Date: 09/25/2022   OT End of Session - 09/25/22 0821     Visit Number 6    Number of Visits 17    Date for OT Re-Evaluation 10/20/22    OT Start Time 0820    OT Stop Time 0859    OT Time Calculation (min) 39 min    Activity Tolerance Patient tolerated treatment well    Behavior During Therapy [facility] for tasks assessed/performed             Past Medical History:  Diagnosis Date   Carpal tunnel syndrome on right 03/10/2017   Pre-diabetes     Past Surgical History:  Procedure Laterality Date   CARPAL TUNNEL RELEASE Right 08/06/2022   Procedure: CARPAL TUNNEL RELEASE ENDOSCOPIC;  Surgeon: Christena Flake, MD;  Location: [facility] ORS;  Service: Orthopedics;  Laterality: Right;   CESAREAN SECTION  1989    There were no vitals filed for this visit.   Subjective Assessment - 09/25/22 0820     Subjective  Seen DR Joice Lofts -can go back light duty - if they have light duty- I did not sleep with glove or splint - my hand and index finger not happy and index finger swollen    Pertinent History Per chart, last MD note: Rhonda Goodman is a 52 y.o. female who presents today for her first postop appointment following a right endoscopic carpal tunnel release. Surgery was performed by Dr. Joice Lofts on 08/06/2022. Overall the patient feels that she is doing well. She does report numbness and tingling the right hand and underlying swelling in the right hand as well. The patient denies any falls or trauma affecting the right wrist. After surgery the patient did begin to experience fevers the patient has still not noticed any purulent drainage coming from the right wrist incision site. The patient has  been working on some gentle range of motion exercises involving her right hand. She does still have occasional sharp pain in right wrist but does feel that this is improving. The patient reports a 7 out of 10 pain score at today's visit.    Patient Stated Goals Pt would like to get rid of pain in the right hand and use it as much as possible to be independent with her daily tasks.    Currently in Pain? Yes    Pain Score 7     Pain Location Hand    Pain Orientation Right    Pain Descriptors / Indicators Tightness;Sore    Pain Type Surgical pain    Pain Onset More than a month ago    Pain Frequency Intermittent                  Patient arrive with increased pain and discomfort in second digit as well as volar palm.  Patient report not sleeping with a glove or brace. Appear patient had been compensating a lot with lateral pinch or key grip.  Causing increased edema and pain in the second digit. Reinforced with patient again importance of contrast as well as Isotoner glove. Provided her with a new Isotoner glove.        OT Treatments/Exercises (OP) - 09/25/22 0001       RUE  Fluidotherapy   Number Minutes Fluidotherapy 8 Minutes    RUE Fluidotherapy Location Hand;Wrist    Comments AROM  decrease edema nad pain            Scar adhere limiting composite thumb flexion/opposition and wrist flexion extension.   Educated patient on anatomy of carpal tunnel postsurgery. Focus on scar massage using Coban and mini massager this date with great success - for increased traction and initiate for patient to use at home    Done metacarpal spreads as well as webspace soft tissue massage-patient to have family do at home 10 reps pain-free Continue [employer] for nighttime - Issue   Patient to continue with Isotoner glove after doing contrast to decrease edema in second digit   patient to do active assisted range of motion for thumb palmar radial abduction pain-free 10 reps Thumb IP  and MC flexion prior to active assisted range of motion for composite thumb flexion prior to opposition Improved greatly for opposition to all digits pain-free   Reinforced with patient again to focus on motion instead of any resistance or strength with tendon glides keeping pain under 3/10 with composite fist  Reviewed again prayer stretch for composite extension 10 reps pain-free -was little irritated over ulnar hand pisiform  Improved in session   Patient can do home exercises 3 times a day. Tolerated changes in home program very well. Did simulate some activities that she does at work putting blood pressure cuff on pushing pull doors turn doorknobs writing. Fitted patient with a neoprene thumb wrist wrap for support but not limiting motion.-To be used with activities that aggravate and cause pain and swelling.           OT Education - 09/25/22 [phone]     Education Details progress and changes of HEP    Person(s) Educated Patient    Methods Explanation;Demonstration;Handout    Comprehension Verbalized understanding;Returned demonstration              OT Short Term Goals - 08/28/22 2049       OT SHORT TERM GOAL #1   Title Pt will demonstrate knowledge and application of contrast for edema control in R hand and wrist.    Baseline Eval:  no current knowledge.    Time 3    Period Weeks    Status New    Target Date 09/15/22               OT Long Term Goals - 08/28/22 2050       OT LONG TERM GOAL #1   Title Pt will demonstrate independence in home exercise program for right hand and wrist.    Baseline Eval:  no current program    Time 8    Period Weeks    Status New    Target Date 10/20/22      OT LONG TERM GOAL #2   Title Pt will demonstrate ability to don and doff regular bra with modified independence.    Baseline Eval:  wearing sports bra, difficulty with fasteners.    Time 8    Period Weeks    Status New    Target Date 10/20/22      OT LONG TERM  GOAL #3   Title Pt will demonstrate improved gripping patterns to cut meat with modified independence.    Baseline Eval:  difficulty with cutting meat.    Time 8    Period Weeks    Status New    Target  Date 10/20/22      OT LONG TERM GOAL #4   Title Right wrist active range of motion and strength increased to within normal limits for patient to push and pull heavy door open with no increase symptoms    Baseline Eval:  limited wrist motion    Time 8    Period Weeks    Status New    Target Date 10/20/22      OT LONG TERM GOAL #5   Title Right digit flexion improved to WNL symptom-free for patient to hold utensils and book without increase symptoms    Baseline Eval:  decreased AROM of digits, unable to make a composite fist    Time 8    Period Weeks    Status New    Target Date 10/20/22      Long Term Additional Goals   Additional Long Term Goals Yes      OT LONG TERM GOAL #6   Title Right grip and prehension strength increased to within normal limits for patient's age to be able to turn a doorknob symptom-free    Baseline Eval: No strengthening yet - pt 2.5 wks s/p - patient's resting pain 6 /10 with scar tissue and edema /unable to make a composite fist    Time 8    Period Weeks    Status New    Target Date 10/20/22                   Plan - 09/25/22 7829     Clinical Impression Statement Pt is a 52 yo female referred to Occupational Therapy for evaluation and treatment for Carpal tunnel syndrome, right   S/P endoscopic carpal tunnel release  by Dr. Joice Lofts on 08-06-2022. Pt presents at eval with pain in right hand and wrist, decreased ROM, decreased strength, decreased sensation and decreased functional use for self care and IADL tasks.  Pt made great progress the last 2 wks in edema, pain and scar tissue with increase AROM.  Pt was doing very well last week and was able to push, pull door and turn doorknob without pain - carry 4-5 lbs without pain.  This date arrive with  increase swelling and pain in 2nd digit -and report increase pain volar wrist and thenar eminence because not sleeping with glove or brace. Pain decrease in session and showed increase AROM. Scar massage, soft tissue to volar wrist and hand done to decrease pain and edema.  Reinforce with pt again to do  contrast  and isotoner glove the next few days. - Cont  [facility] care for night time.. Cont to work towards goals in plan of care.  Pt can if possible return to work but light duty - enlarge her pen grip  and grips over all - use palms if needed to decrease pain and increase ease. Pt would benefit from skilled OT services to maximize safety and independence in necessary daily tasks.    OT Occupational Profile and History Detailed Assessment- Review of Records and additional review of physical, cognitive, psychosocial history related to current functional performance    Occupational performance deficits (Please refer to evaluation for details): ADL's;IADL's;Leisure    Body Structure / Function / Physical Skills Coordination;Decreased knowledge of use of DME;Dexterity;Edema;Flexibility;FMC;IADL;Pain;ROM;Scar mobility;Sensation;Strength;UE functional use    Psychosocial Skills Habits;Routines and Behaviors;Environmental  Adaptations    Rehab Potential Good    Clinical Decision Making Several treatment options, min-mod task modification necessary    Comorbidities Affecting Occupational Performance: May  have comorbidities impacting occupational performance    Modification or Assistance to Complete Evaluation  No modification of tasks or assist necessary to complete eval    OT Frequency 2x / week    OT Duration 4 weeks    OT Treatment/Interventions Self-care/ADL training;Fluidtherapy;Therapeutic exercise;Scar mobilization;Therapeutic activities;Cryotherapy;Moist Heat;Ultrasound;Paraffin;Contrast Bath;Neuromuscular education;DME and/or AE instruction;Manual Therapy;Passive range of motion;Splinting;Patient/family  education    Consulted and Agree with Plan of Care Patient             Patient will benefit from skilled therapeutic intervention in order to improve the following deficits and impairments:   Body Structure / Function / Physical Skills: Coordination, Decreased knowledge of use of DME, Dexterity, Edema, Flexibility, FMC, IADL, Pain, ROM, Scar mobility, Sensation, Strength, UE functional use   Psychosocial Skills: Habits, Routines and Behaviors, Environmental  Adaptations   Visit Diagnosis: Muscle weakness (generalized)  Scar condition and fibrosis of skin  Pain in right wrist  Stiffness of right wrist, not elsewhere classified    Problem List Patient Active Problem List   Diagnosis Date Noted   Carpal tunnel syndrome on right 03/10/2017   Heel spur 08/23/2015   Plantar fasciitis 08/23/2015   HAV (hallux abducto valgus) 08/23/2015   Bony exostosis 08/23/2015    Oletta Cohn, OTR/L,CLT 09/25/2022, 9:11 AM  [facility] [address], [place], [address] Phone: [phone]   Fax:  [phone]  Name: Rhonda Goodman MRN: [id number] Date of Birth: 03-17-1971

## 2022-09-29 ENCOUNTER — Encounter: Payer: Self-pay | Admitting: Occupational Therapy

## 2022-09-29 ENCOUNTER — Ambulatory Visit: Payer: 59 | Admitting: Occupational Therapy

## 2022-09-29 DIAGNOSIS — M25531 Pain in right wrist: Secondary | ICD-10-CM | POA: Diagnosis not present

## 2022-09-29 DIAGNOSIS — L905 Scar conditions and fibrosis of skin: Secondary | ICD-10-CM

## 2022-09-29 DIAGNOSIS — M6281 Muscle weakness (generalized): Secondary | ICD-10-CM

## 2022-09-29 DIAGNOSIS — M25631 Stiffness of right wrist, not elsewhere classified: Secondary | ICD-10-CM | POA: Diagnosis not present

## 2022-09-29 NOTE — Therapy (Signed)
[facility] [address] [place], [address] Phone: [phone]   Fax:  [phone]  Occupational Therapy Treatment  Patient Details  Name: Rhonda Goodman MRN: [id number] Date of Birth: 09-19-1970 Referring Provider (OT): Poggi,   Encounter Date: 09/29/2022   OT End of Session - 09/29/22 0823     Visit Number 7    Number of Visits 17    Date for OT Re-Evaluation 10/20/22    OT Start Time 0823    OT Stop Time 0905    OT Time Calculation (min) 42 min    Activity Tolerance Patient tolerated treatment well    Behavior During Therapy [facility] for tasks assessed/performed             Past Medical History:  Diagnosis Date   Carpal tunnel syndrome on right 03/10/2017   Pre-diabetes     Past Surgical History:  Procedure Laterality Date   CARPAL TUNNEL RELEASE Right 08/06/2022   Procedure: CARPAL TUNNEL RELEASE ENDOSCOPIC;  Surgeon: Christena Flake, MD;  Location: [facility] ORS;  Service: Orthopedics;  Laterality: Right;   CESAREAN SECTION  1989    There were no vitals filed for this visit.   Subjective Assessment - 09/29/22 0823     Subjective  My palm was hurting over the weekend - probably done to much. Wrist sore. I am going to try and go back to work Wed and Thursday 12 hrs and if to much - tell them I want to start with only 1/2 days    Pertinent History Per chart, last MD note: Rhonda Goodman is a 52 y.o. female who presents today for her first postop appointment following a right endoscopic carpal tunnel release. Surgery was performed by Dr. Joice Lofts on 08/06/2022. Overall the patient feels that she is doing well. She does report numbness and tingling the right hand and underlying swelling in the right hand as well. The patient denies any falls or trauma affecting the right wrist. After surgery the patient did begin to experience fevers the patient has still not noticed any purulent drainage coming from the right wrist  incision site. The patient has been working on some gentle range of motion exercises involving her right hand. She does still have occasional sharp pain in right wrist but does feel that this is improving. The patient reports a 7 out of 10 pain score at today's visit.    Patient Stated Goals Pt would like to get rid of pain in the right hand and use it as much as possible to be independent with her daily tasks.    Currently in Pain? Yes    Pain Score 2     Pain Location Hand    Pain Orientation Right    Pain Descriptors / Indicators Tightness;Aching;Sore    Pain Type Surgical pain    Pain Onset More than a month ago    Pain Frequency Intermittent                [facility] OT Assessment - 09/29/22 0001       Strength   Right Hand Grip (lbs) 30    Right Hand Lateral Pinch 15 lbs    Right Hand 3 Point Pinch 12 lbs                      OT Treatments/Exercises (OP) - 09/29/22 0001       RUE Fluidotherapy   Number Minutes Fluidotherapy  8 Minutes    RUE Fluidotherapy Location Hand;Wrist    Comments 2 cycle of ice - contrast - decrease pain and incrase ROM            Scar adhesion improved -and pt still some fibrosis and swelling over thenar eminence limiting composite thumb flexion/opposition and wrist flexion/extension.   Educated patient on anatomy of carpal tunnel postsurgery. Focus on scar massage using Coban and mini massager this date with great success - As well as  metacarpal spreads as well as webspace soft tissue massage-patient to have family do at home 10 reps pain-free Provided pt light blue putty to roll over volar wrist , palm and in circles Continue Cica-Care for nighttime -    Patient to continue with Isotoner glove after doing contrast to decrease edema in second digit   patient to do active assisted range of motion for thumb palmar radial abduction pain-free 10 reps Thumb IP and MC flexion prior to active assisted range of motion for composite  thumb flexion prior to opposition Improved greatly for opposition to all digits pain-free   Reinforced with patient again to focus on motion instead of any resistance or strength with tendon glides keeping pain under 3/10 with composite fist  Reviewed again prayer stretch for composite extension 10 reps pain-free -was little irritated over ulnar hand pisiform  Add light table slides - 20 reps pain free  - to be done 2 x day     Patient can do home exercises 3 times a day. Tolerated changes in home program very well. Pt using  neoprene thumb wrist wrap for support but not limiting motion.-To be used with activities that aggravate and cause pain and swelling at home and work this week          OT Education - 09/29/22 [phone]     Education Details progress and changes of HEP    Person(s) Educated Patient    Methods Explanation;Demonstration;Handout    Comprehension Verbalized understanding;Returned demonstration              OT Short Term Goals - 08/28/22 2049       OT SHORT TERM GOAL #1   Title Pt will demonstrate knowledge and application of contrast for edema control in R hand and wrist.    Baseline Eval:  no current knowledge.    Time 3    Period Weeks    Status New    Target Date 09/15/22               OT Long Term Goals - 08/28/22 2050       OT LONG TERM GOAL #1   Title Pt will demonstrate independence in home exercise program for right hand and wrist.    Baseline Eval:  no current program    Time 8    Period Weeks    Status New    Target Date 10/20/22      OT LONG TERM GOAL #2   Title Pt will demonstrate ability to don and doff regular bra with modified independence.    Baseline Eval:  wearing sports bra, difficulty with fasteners.    Time 8    Period Weeks    Status New    Target Date 10/20/22      OT LONG TERM GOAL #3   Title Pt will demonstrate improved gripping patterns to cut meat with modified independence.    Baseline Eval:  difficulty  with cutting meat.    Time 8  Period Weeks    Status New    Target Date 10/20/22      OT LONG TERM GOAL #4   Title Right wrist active range of motion and strength increased to within normal limits for patient to push and pull heavy door open with no increase symptoms    Baseline Eval:  limited wrist motion    Time 8    Period Weeks    Status New    Target Date 10/20/22      OT LONG TERM GOAL #5   Title Right digit flexion improved to WNL symptom-free for patient to hold utensils and book without increase symptoms    Baseline Eval:  decreased AROM of digits, unable to make a composite fist    Time 8    Period Weeks    Status New    Target Date 10/20/22      Long Term Additional Goals   Additional Long Term Goals Yes      OT LONG TERM GOAL #6   Title Right grip and prehension strength increased to within normal limits for patient's age to be able to turn a doorknob symptom-free    Baseline Eval: No strengthening yet - pt 2.5 wks s/p - patient's resting pain 6 /10 with scar tissue and edema /unable to make a composite fist    Time 8    Period Weeks    Status New    Target Date 10/20/22                   Plan - 09/29/22 0823     Clinical Impression Statement Pt is a 52 yo female referred to Occupational Therapy for evaluation and treatment for Carpal tunnel syndrome, right   S/P endoscopic carpal tunnel release  by Dr. Joice Lofts on 08-06-2022. Pt presents at eval with pain in right hand and wrist, decreased ROM, decreased strength, decreased sensation and decreased functional use for self care and IADL tasks.  Pt  making great progress in edema, pain and scar tissue with increase AROM and functional use.  Pt cont to have some increase swelling and pain in 2nd digit -and report  pain volar wrist and thenar eminence  with use and attempting WB. Pain decrease in session and showed increase AROM. Scar massage, soft tissue to volar wrist and hand done to decrease pain and edema.   Reinforce with pt again to do  contrast  and isotoner glove the next few days. - Cont  CICA care for night time.. Add this date table slides pain free and rolling over putty for soft tissue mobs.   Pt plan to return to work light duty this week- enlarge her pen grip  and grips over all - use palms if needed to decrease pain and increase ease. Pt would benefit from skilled OT services to maximize safety and independence in necessary daily tasks.    OT Occupational Profile and History Detailed Assessment- Review of Records and additional review of physical, cognitive, psychosocial history related to current functional performance    Occupational performance deficits (Please refer to evaluation for details): ADL's;IADL's;Leisure    Body Structure / Function / Physical Skills Coordination;Decreased knowledge of use of DME;Dexterity;Edema;Flexibility;FMC;IADL;Pain;ROM;Scar mobility;Sensation;Strength;UE functional use    Psychosocial Skills Habits;Routines and Behaviors;Environmental  Adaptations    Rehab Potential Good    Clinical Decision Making Several treatment options, min-mod task modification necessary    Comorbidities Affecting Occupational Performance: May have comorbidities impacting occupational performance    Modification  or Assistance to Complete Evaluation  No modification of tasks or assist necessary to complete eval    OT Frequency 2x / week    OT Duration 4 weeks    OT Treatment/Interventions Self-care/ADL training;Fluidtherapy;Therapeutic exercise;Scar mobilization;Therapeutic activities;Cryotherapy;Moist Heat;Ultrasound;Paraffin;Contrast Bath;Neuromuscular education;DME and/or AE instruction;Manual Therapy;Passive range of motion;Splinting;Patient/family education    Consulted and Agree with Plan of Care Patient             Patient will benefit from skilled therapeutic intervention in order to improve the following deficits and impairments:   Body Structure / Function / Physical  Skills: Coordination, Decreased knowledge of use of DME, Dexterity, Edema, Flexibility, FMC, IADL, Pain, ROM, Scar mobility, Sensation, Strength, UE functional use   Psychosocial Skills: Habits, Routines and Behaviors, Environmental  Adaptations   Visit Diagnosis: Muscle weakness (generalized)  Scar condition and fibrosis of skin  Pain in right wrist  Stiffness of right wrist, not elsewhere classified    Problem List Patient Active Problem List   Diagnosis Date Noted   Carpal tunnel syndrome on right 03/10/2017   Heel spur 08/23/2015   Plantar fasciitis 08/23/2015   HAV (hallux abducto valgus) 08/23/2015   Bony exostosis 08/23/2015    Oletta Cohn, OTR/L,CLT 09/29/2022, 11:40 AM  [facility] [address], [place], [address] Phone: [phone]   Fax:  [phone]  Name: LASHAYLA ARMES MRN: [id number] Date of Birth: 08-May-1971

## 2022-10-02 ENCOUNTER — Ambulatory Visit: Payer: 59 | Attending: Student | Admitting: Occupational Therapy

## 2022-10-02 DIAGNOSIS — M25631 Stiffness of right wrist, not elsewhere classified: Secondary | ICD-10-CM | POA: Insufficient documentation

## 2022-10-02 DIAGNOSIS — M6281 Muscle weakness (generalized): Secondary | ICD-10-CM | POA: Insufficient documentation

## 2022-10-02 DIAGNOSIS — M25531 Pain in right wrist: Secondary | ICD-10-CM | POA: Insufficient documentation

## 2022-10-02 DIAGNOSIS — L905 Scar conditions and fibrosis of skin: Secondary | ICD-10-CM | POA: Insufficient documentation

## 2022-10-08 ENCOUNTER — Ambulatory Visit: Payer: 59 | Admitting: Occupational Therapy

## 2022-10-08 DIAGNOSIS — M6281 Muscle weakness (generalized): Secondary | ICD-10-CM | POA: Diagnosis not present

## 2022-10-08 DIAGNOSIS — L905 Scar conditions and fibrosis of skin: Secondary | ICD-10-CM

## 2022-10-08 DIAGNOSIS — M25631 Stiffness of right wrist, not elsewhere classified: Secondary | ICD-10-CM

## 2022-10-08 DIAGNOSIS — M25531 Pain in right wrist: Secondary | ICD-10-CM | POA: Diagnosis not present

## 2022-10-08 NOTE — Therapy (Signed)
[facility] [address], [place], [address] Phone: [phone]   Fax:  [phone]  Occupational Therapy Treatment  Patient Details  Name: KRYSLYNN LUCKENBILL MRN: [id number] Date of Birth: [birth year] Referring Provider (OT): Poggi,   Encounter Date: 10/08/2022   OT End of Session - 10/08/22 1410     Visit Number 8    Number of Visits 17    Date for OT Re-Evaluation 10/20/22    OT Start Time 1406    OT Stop Time 1445    OT Time Calculation (min) 39 min    Activity Tolerance Patient tolerated treatment well    Behavior During Therapy [facility] for tasks assessed/performed             Past Medical History:  Diagnosis Date   Carpal tunnel syndrome on right 03/10/2017   Pre-diabetes     Past Surgical History:  Procedure Laterality Date   CARPAL TUNNEL RELEASE Right 08/06/2022   Procedure: CARPAL TUNNEL RELEASE ENDOSCOPIC;  Surgeon: Christena Flake, MD;  Location: [facility] ORS;  Service: Orthopedics;  Laterality: Right;   CESAREAN SECTION  1989    There were no vitals filed for this visit.   Subjective Assessment - 10/08/22 1408     Subjective  Back to work - 12 hrs day - light duty - writing , lifting , pulling and carrying bothters me - pain now 7-8/10 over thumb side of hand.Worse when bending my  wrist    Pertinent History Per chart, last MD note: Rhonda Goodman is a 52 y.o. female who presents today for her first postop appointment following a right endoscopic carpal tunnel release. Surgery was performed by Dr. Joice Lofts on 08/06/2022. Overall the patient feels that she is doing well. She does report numbness and tingling the right hand and underlying swelling in the right hand as well. The patient denies any falls or trauma affecting the right wrist. After surgery the patient did begin to experience fevers the patient has still not noticed any purulent drainage coming from the right wrist incision site. The patient has  been working on some gentle range of motion exercises involving her right hand. She does still have occasional sharp pain in right wrist but does feel that this is improving. The patient reports a 7 out of 10 pain score at today's visit.    Patient Stated Goals Pt would like to get rid of pain in the right hand and use it as much as possible to be independent with her daily tasks.    Currently in Pain? Yes    Pain Score 7     Pain Location --   radial side of hand , thumb into the palm   Pain Orientation Right    Pain Descriptors / Indicators Tightness;Aching    Pain Type Surgical pain    Pain Onset More than a month ago    Pain Frequency Intermittent               Patient with increased pain and tenderness over thumb thenar eminence as well as pisiform and volar wrist scar. 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   Focus on scar massage using Coban and mini massager this date with great success - As well as  metacarpal spreads as well as webspace soft tissue massage-patient to have family do at home 10 reps pain-free Done [employer] tool #2 and massage tone over volar hand as well as wrist and forearm prior to passive range of motion for wrist extension and flexion Continue light blue putty to roll over volar wrist , palm and in circles Continue Cica-Care for nighttime -     Patient to continue with Isotoner glove at nighttime  patient to do active assisted range of motion for thumb palmar radial abduction pain-free 10  reps Thumb IP and MC flexion prior to active assisted range of motion for composite thumb flexion prior to opposition Improved greatly for opposition to all digits pain-free   Reinforced with patient again to focus on motion instead of any resistance or strength with tendon glides keeping pain under 3/10 with composite fist  Reviewed again prayer stretch for composite extension 10 reps pain-free -was little irritated over ulnar hand pisiform       Patient can do home exercises after long days at work to decrease pain and maintain motion   pt using  neoprene thumb wrist wrap for support -to use at work at least 50% of the time.            OT Education - 10/08/22 1410     Education Details progress and changes of HEP    Person(s) Educated Patient    Methods Explanation;Demonstration;Handout    Comprehension Verbalized understanding;Returned demonstration              OT Short Term Goals - 08/28/22 2049       OT SHORT TERM GOAL #1   Title Pt will demonstrate knowledge and application of contrast for edema control in R hand and wrist.    Baseline Eval:  no current knowledge.    Time 3    Period Weeks    Status New    Target Date 09/15/22               OT Long Term Goals - 08/28/22 2050       OT LONG TERM GOAL #1   Title Pt will demonstrate independence in home exercise program for right hand and wrist.    Baseline Eval:  no current program    Time 8    Period Weeks    Status New    Target Date 10/20/22      OT LONG TERM GOAL #2   Title Pt will demonstrate ability to don and doff regular bra with modified independence.    Baseline Eval:  wearing sports bra, difficulty with fasteners.    Time 8    Period Weeks    Status New    Target Date 10/20/22      OT LONG TERM GOAL #3   Title Pt will demonstrate improved gripping patterns to cut meat with modified independence.    Baseline Eval:  difficulty with cutting meat.    Time 8    Period Weeks     Status New    Target Date 10/20/22      OT LONG TERM GOAL #4   Title  Right wrist active range of motion and strength increased to within normal limits for patient to push and pull heavy door open with no increase symptoms    Baseline Eval:  limited wrist motion    Time 8    Period Weeks    Status New    Target Date 10/20/22      OT LONG TERM GOAL #5   Title Right digit flexion improved to WNL symptom-free for patient to hold utensils and book without increase symptoms    Baseline Eval:  decreased AROM of digits, unable to make a composite fist    Time 8    Period Weeks    Status New    Target Date 10/20/22      Long Term Additional Goals   Additional Long Term Goals Yes      OT LONG TERM GOAL #6   Title Right grip and prehension strength increased to within normal limits for patient's age to be able to turn a doorknob symptom-free    Baseline Eval: No strengthening yet - pt 2.5 wks s/p - patient's resting pain 6 /10 with scar tissue and edema /unable to make a composite fist    Time 8    Period Weeks    Status New    Target Date 10/20/22                   Plan - 10/08/22 1702     Clinical Impression Statement Pt is a 52 yo female referred to Occupational Therapy for evaluation and treatment for Carpal tunnel syndrome, right   S/P endoscopic carpal tunnel release  by Dr. Joice Lofts on 08-06-2022. Pt presents at eval with pain in right hand and wrist, decreased ROM, decreased strength, decreased sensation and decreased functional use for self care and IADL tasks.  Pt  made great progress in edema, pain and scar tissue with increase AROM and functional use. 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 Increase pain over the few days and shift. In session pain decrease and showed increase AROM. Scar massage, soft tissue to volar wrist and hand done to decrease pain and edema.  Reinforce with pt again to do  contrast, CMC neoprene splint at work 50%  time and  isotoner glove night time - Cont  CICA care for night time.. Cont to modify act at work - enlarge her pen grip  and grips over all, using larger joints and padding under CT with on computer. Pt would benefit from skilled OT services to maximize safety and independence in necessary daily tasks.             Patient will benefit from skilled therapeutic intervention in order to improve the following deficits and impairments:           Visit Diagnosis: Muscle weakness (generalized)  Scar condition and fibrosis of skin  Pain in right wrist  Stiffness of right wrist, not elsewhere classified    Problem List Patient Active Problem List   Diagnosis Date Noted   Carpal tunnel syndrome on right 03/10/2017   Heel spur 08/23/2015   Plantar fasciitis 08/23/2015   HAV (hallux abducto valgus) 08/23/2015   Bony exostosis 08/23/2015    Oletta Cohn, OTR/L,CLT 10/08/2022, 5:08 PM  [facility] [address] [place], [address] Phone: [phone]   Fax:  [phone]  Name: LIZBETT BUCHANNON MRN: [id number] Date of  Birth: 02/12/1971

## 2022-10-13 ENCOUNTER — Ambulatory Visit: Payer: 59 | Admitting: Occupational Therapy

## 2022-10-30 ENCOUNTER — Ambulatory Visit: Payer: 59 | Admitting: Occupational Therapy

## 2022-10-30 DIAGNOSIS — M6281 Muscle weakness (generalized): Secondary | ICD-10-CM

## 2022-10-30 DIAGNOSIS — M25531 Pain in right wrist: Secondary | ICD-10-CM | POA: Diagnosis not present

## 2022-10-30 DIAGNOSIS — L905 Scar conditions and fibrosis of skin: Secondary | ICD-10-CM

## 2022-10-30 DIAGNOSIS — M25631 Stiffness of right wrist, not elsewhere classified: Secondary | ICD-10-CM

## 2022-10-30 NOTE — Therapy (Signed)
[facility] [address], [place], [address] Phone: [phone]   Fax:  [phone]  Occupational Therapy Treatment  Patient Details  Name: Rhonda Goodman MRN: [id number] Date of Birth: Jan 22, 1971 Referring Provider (OT): Poggi,   Encounter Date: 10/30/2022   OT End of Session - 10/30/22 1231     Visit Number 9    Number of Visits 12    Date for OT Re-Evaluation 12/11/22    OT Start Time 1200    OT Stop Time 1232    OT Time Calculation (min) 32 min    Activity Tolerance Patient tolerated treatment well    Behavior During Therapy [facility] for tasks assessed/performed             Past Medical History:  Diagnosis Date   Carpal tunnel syndrome on right 03/10/2017   Pre-diabetes     Past Surgical History:  Procedure Laterality Date   CARPAL TUNNEL RELEASE Right 08/06/2022   Procedure: CARPAL TUNNEL RELEASE ENDOSCOPIC;  Surgeon: Christena Flake, MD;  Location: [facility] ORS;  Service: Orthopedics;  Laterality: Right;   CESAREAN SECTION  1989    There were no vitals filed for this visit.   Subjective Assessment - 10/30/22 1230     Subjective  I am working 12 hrs shifts and we are one lady short- seeing a lot of pt- I had been wearing my hards splint at times    Pertinent History Per chart, last MD note: Rhonda Goodman is a 52 y.o. female who presents today for her first postop appointment following a right endoscopic carpal tunnel release. Surgery was performed by Dr. Joice Lofts on 08/06/2022. Overall the patient feels that she is doing well. She does report numbness and tingling the right hand and underlying swelling in the right hand as well. The patient denies any falls or trauma affecting the right wrist. After surgery the patient did begin to experience fevers the patient has still not noticed any purulent drainage coming from the right wrist incision site. The patient has been working on some gentle range of motion  exercises involving her right hand. She does still have occasional sharp pain in right wrist but does feel that this is improving. The patient reports a 7 out of 10 pain score at today's visit.    Patient Stated Goals Pt would like to get rid of pain in the right hand and use it as much as possible to be independent with her daily tasks.    Currently in Pain? Yes    Pain Score 4     Pain Location Wrist    Pain Orientation Right    Pain Descriptors / Indicators Aching    Pain Onset More than a month ago                [facility] OT Assessment - 10/30/22 0001       AROM   Right Wrist Extension 67 Degrees    Right Wrist Flexion 85 Degrees      Strength   Right Hand Grip (lbs) 48    Right Hand Lateral Pinch 16 lbs    Right Hand 3 Point Pinch 14 lbs    Left Hand Grip (lbs) 70    Left Hand Lateral Pinch 18 lbs    Left Hand 3 Point Pinch 18 lbs            Patient arrived coming from work.  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 Still on light duty. Pain and achiness about 4/10 over the wrist and thenar eminence of thumb Upon assessment of wrist active range of motion in all planes as well as thumb palmar radial abduction Soft tissue to webspace Patient had no pain           OT Treatments/Exercises (OP) - 10/30/22 0001       RUE Fluidotherapy   Number Minutes Fluidotherapy 8 Minutes    RUE Fluidotherapy Location Hand;Wrist    Comments decrease pain and increase ROM            Patient still focus a little bit on scar massage with wrist extension and flexion endrange  as well as  metacarpal spreads as well as webspace soft tissue massage-patient to have family do at home 10 reps pain-free Patient encouraged to do during the day as well as at home composite wrist extension like wall slides or table slides  As well as resting wrist and somewhat of a extension position during the day  Active assisted range of motion for wrist flexion extension  Wrist  and forearm strength in all planes and in range 5/5    patient to do active assisted range of motion for thumb palmar radial abduction pain-free 10 reps encouraged to do thumb palmar radial abduction during the day also at work  Patient fitted with a CMC neoprene wrap to use during the day instead of the thumb spica. It appeared during the day patient is fighting the thumb spica because of increased use. Continue to modify as needed using palm or larger joints to pick up and hold things as well as building or enlarging grips.  Patient to follow-up with Dr. Joice Lofts 10 June. Patient can follow-up with me 2 more visits over the next 6 weeks as returning and doing more at work all maintaining progress right dominant hand and wrist            OT Education - 10/30/22 1231     Education Details progress and changes of HEP    Person(s) Educated Patient    Methods Explanation;Demonstration;Handout    Comprehension Verbalized understanding;Returned demonstration              OT Short Term Goals - 10/30/22 1237       OT SHORT TERM GOAL #1   Title Pt will demonstrate knowledge and application of contrast for edema control in R hand and wrist.    Status Achieved               OT Long Term Goals - 10/30/22 1237       OT LONG TERM GOAL #1   Title Pt will demonstrate independence in home exercise program for right hand and wrist.    Status Achieved      OT LONG TERM GOAL #2   Title Pt will demonstrate ability to don and doff regular bra with modified independence.    Status Achieved      OT LONG TERM GOAL #3   Title Pt will demonstrate improved gripping patterns to cut meat with modified independence.    Status Achieved      OT LONG TERM GOAL #4   Title Right wrist active range of motion and strength increased to within normal limits for patient to push and pull heavy door open with no increase symptoms    Status Achieved      OT LONG TERM GOAL #5   Title Right digit  flexion improved to WNL symptom-free for patient to hold utensils and book without increase symptoms    Baseline still some pain during day working 12hrs shift- pain in thumb thenar eminence , wrist    Time 6    Period Weeks    Status On-going    Target Date 12/11/22      OT LONG TERM GOAL #6   Title Right grip and prehension strength increased to within normal limits for patient's age to be able to turn a doorknob symptom-free    Baseline Eval: No strengthening yet - pt 2.5 wks s/p - patient's resting pain 6 /10 with scar tissue and edema /unable to make a composite fist NOW great progress - grip still decrease compare to L - but improved to [facility] for grip and prehension    Time 6    Period Weeks    Status On-going    Target Date 12/11/22                   Plan - 10/30/22 1231     Clinical Impression Statement Pt is a 52 yo female referred to Occupational Therapy for evaluation and treatment for Carpal tunnel syndrome, right   S/P endoscopic carpal tunnel release  by Dr. Joice Lofts on 08-06-2022.  Pt  made great progress in edema, pain and scar tissue with increase AROM and functional use. 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 Had some increase pain working in wrist and thumb thenar eminence - but after some AROM for wrist and thumb - pain decreased. Pt also was wearing her thumb spica at work at times but think is is fightning the hard splint. Fitted with neoprene CMC wrap to use during day on and off. Pt to work on wrist and digits composite extention and flexion. Great progress in grip and prehension strength - wrist strength WNL 5/5 in reange. Pt to follow up with surgeon and follow up with me about 2 -3 more session over the next 6 wks if needed.  Cont to modify act at work - enlarge grips over all, using larger joints and padding under CT/pisiforme when on computer. Pt would benefit from skilled OT services to maximize safety and independence in  necessary daily tasks to return to prior level of function.    OT Occupational Profile and History Detailed Assessment- Review of Records and additional review of physical, cognitive, psychosocial history related to current functional performance    Occupational performance deficits (Please refer to evaluation for details): ADL's;IADL's;Leisure    Body Structure / Function / Physical Skills Coordination;Decreased knowledge of use of DME;Dexterity;Edema;Flexibility;FMC;IADL;Pain;ROM;Scar mobility;Sensation;Strength;UE functional use    Psychosocial Skills Habits;Routines and Behaviors;Environmental  Adaptations    Rehab Potential Good    Clinical Decision Making Several treatment options, min-mod task modification necessary    Comorbidities Affecting Occupational Performance: May have comorbidities impacting occupational performance    Modification or Assistance to Complete Evaluation  No modification of tasks or assist necessary to complete eval    OT Frequency Biweekly    OT Duration 6 weeks    OT Treatment/Interventions Self-care/ADL training;Fluidtherapy;Therapeutic exercise;Scar mobilization;Therapeutic activities;Cryotherapy;Moist Heat;Ultrasound;Paraffin;Contrast Bath;Neuromuscular education;DME and/or AE instruction;Manual Therapy;Passive range of motion;Splinting;Patient/family education    Consulted and Agree with Plan of Care Patient             Patient will benefit from skilled therapeutic intervention in order to improve the following deficits and impairments:   Body  Structure / Function / Physical Skills: Coordination, Decreased knowledge of use of DME, Dexterity, Edema, Flexibility, FMC, IADL, Pain, ROM, Scar mobility, Sensation, Strength, UE functional use   Psychosocial Skills: Habits, Routines and Behaviors, Environmental  Adaptations   Visit Diagnosis: Muscle weakness (generalized)  Scar condition and fibrosis of skin  Pain in right wrist  Stiffness of right wrist,  not elsewhere classified    Problem List Patient Active Problem List   Diagnosis Date Noted   Carpal tunnel syndrome on right 03/10/2017   Heel spur 08/23/2015   Plantar fasciitis 08/23/2015   HAV (hallux abducto valgus) 08/23/2015   Bony exostosis 08/23/2015    Oletta Cohn, OTR/L,CLT 10/30/2022, 12:40 PM  [facility] [address] [place], [address] Phone: [phone]   Fax:  [phone]  Name: YESINA LUCIA MRN: [id number] Date of Birth: 06-02-1971

## 2022-11-10 DIAGNOSIS — G5601 Carpal tunnel syndrome, right upper limb: Secondary | ICD-10-CM | POA: Diagnosis not present

## 2022-12-23 DIAGNOSIS — I10 Essential (primary) hypertension: Secondary | ICD-10-CM | POA: Diagnosis not present

## 2022-12-23 DIAGNOSIS — K5730 Diverticulosis of large intestine without perforation or abscess without bleeding: Secondary | ICD-10-CM | POA: Diagnosis not present

## 2022-12-23 DIAGNOSIS — E1169 Type 2 diabetes mellitus with other specified complication: Secondary | ICD-10-CM | POA: Diagnosis not present

## 2022-12-23 DIAGNOSIS — J399 Disease of upper respiratory tract, unspecified: Secondary | ICD-10-CM | POA: Diagnosis not present

## 2023-01-06 DIAGNOSIS — I10 Essential (primary) hypertension: Secondary | ICD-10-CM | POA: Diagnosis not present

## 2023-01-06 DIAGNOSIS — K5190 Ulcerative colitis, unspecified, without complications: Secondary | ICD-10-CM | POA: Diagnosis not present

## 2023-01-06 DIAGNOSIS — E1169 Type 2 diabetes mellitus with other specified complication: Secondary | ICD-10-CM | POA: Diagnosis not present

## 2023-01-09 DIAGNOSIS — G5601 Carpal tunnel syndrome, right upper limb: Secondary | ICD-10-CM | POA: Diagnosis not present

## 2023-02-17 DIAGNOSIS — F32A Depression, unspecified: Secondary | ICD-10-CM | POA: Diagnosis not present

## 2023-02-17 DIAGNOSIS — I10 Essential (primary) hypertension: Secondary | ICD-10-CM | POA: Diagnosis not present

## 2023-02-17 DIAGNOSIS — N951 Menopausal and female climacteric states: Secondary | ICD-10-CM | POA: Diagnosis not present

## 2023-02-17 DIAGNOSIS — K5909 Other constipation: Secondary | ICD-10-CM | POA: Diagnosis not present

## 2023-04-20 DIAGNOSIS — Z6379 Other stressful life events affecting family and household: Secondary | ICD-10-CM | POA: Diagnosis not present

## 2023-04-20 DIAGNOSIS — F79 Unspecified intellectual disabilities: Secondary | ICD-10-CM | POA: Diagnosis not present

## 2023-04-20 DIAGNOSIS — M130 Polyarthritis, unspecified: Secondary | ICD-10-CM | POA: Diagnosis not present

## 2023-04-20 DIAGNOSIS — E785 Hyperlipidemia, unspecified: Secondary | ICD-10-CM | POA: Diagnosis not present

## 2023-04-20 DIAGNOSIS — R739 Hyperglycemia, unspecified: Secondary | ICD-10-CM | POA: Diagnosis not present

## 2023-04-20 DIAGNOSIS — N398 Other specified disorders of urinary system: Secondary | ICD-10-CM | POA: Diagnosis not present

## 2023-04-20 DIAGNOSIS — R7303 Prediabetes: Secondary | ICD-10-CM | POA: Diagnosis not present

## 2023-04-20 DIAGNOSIS — I10 Essential (primary) hypertension: Secondary | ICD-10-CM | POA: Diagnosis not present

## 2023-04-20 DIAGNOSIS — N951 Menopausal and female climacteric states: Secondary | ICD-10-CM | POA: Diagnosis not present

## 2023-04-20 DIAGNOSIS — Z6836 Body mass index (BMI) 36.0-36.9, adult: Secondary | ICD-10-CM | POA: Diagnosis not present

## 2023-04-22 DIAGNOSIS — Z01419 Encounter for gynecological examination (general) (routine) without abnormal findings: Secondary | ICD-10-CM | POA: Diagnosis not present

## 2023-04-22 DIAGNOSIS — Z1151 Encounter for screening for human papillomavirus (HPV): Secondary | ICD-10-CM | POA: Diagnosis not present

## 2023-04-22 DIAGNOSIS — Z6835 Body mass index (BMI) 35.0-35.9, adult: Secondary | ICD-10-CM | POA: Diagnosis not present

## 2023-04-22 DIAGNOSIS — Z124 Encounter for screening for malignant neoplasm of cervix: Secondary | ICD-10-CM | POA: Diagnosis not present

## 2023-04-22 DIAGNOSIS — N951 Menopausal and female climacteric states: Secondary | ICD-10-CM | POA: Diagnosis not present

## 2023-04-23 DIAGNOSIS — H3589 Other specified retinal disorders: Secondary | ICD-10-CM | POA: Diagnosis not present

## 2023-04-23 DIAGNOSIS — H43393 Other vitreous opacities, bilateral: Secondary | ICD-10-CM | POA: Diagnosis not present

## 2023-05-05 DIAGNOSIS — I10 Essential (primary) hypertension: Secondary | ICD-10-CM | POA: Diagnosis not present

## 2023-05-05 DIAGNOSIS — E782 Mixed hyperlipidemia: Secondary | ICD-10-CM | POA: Diagnosis not present

## 2023-05-05 DIAGNOSIS — R7303 Prediabetes: Secondary | ICD-10-CM | POA: Diagnosis not present

## 2023-09-24 ENCOUNTER — Ambulatory Visit: Admitting: Podiatry

## 2023-09-24 DIAGNOSIS — Z79899 Other long term (current) drug therapy: Secondary | ICD-10-CM

## 2023-09-24 DIAGNOSIS — B351 Tinea unguium: Secondary | ICD-10-CM

## 2023-09-24 DIAGNOSIS — L0889 Other specified local infections of the skin and subcutaneous tissue: Secondary | ICD-10-CM | POA: Diagnosis not present

## 2023-09-24 NOTE — Progress Notes (Signed)
  Subjective:  Patient ID: Rhonda Goodman, female    DOB: Mar 17, 1971,  MRN: [id number]  Chief Complaint  Patient presents with   Nail Problem    Nail fungus     53 y.o. female presents with the above complaint.  Patient presents with thickened onychodystrophy mycotic toenails x 10.  Patient also presents with complaint of athlete's foot to bilateral feet.  She states been present for quite some time is progressing and worse she would like to discuss treatment options for she has not seen anyone as prior to see me.  She has tried topical over-the-counter medication and which has helped.  She would like to discuss prescription medication for both of these issues.   Review of Systems: Negative except as noted in the HPI. Denies N/V/F/Ch.  Past Medical History:  Diagnosis Date   Carpal tunnel syndrome on right 03/10/2017   Pre-diabetes     Current Outpatient Medications:    terbinafine (LAMISIL) 250 MG tablet, Take 1 tablet (250 mg total) by mouth daily., Disp: 90 tablet, Rfl: 0   Cholecalciferol (VITAMIN D3) 1.25 MG (50000 UT) capsule, Take 50,000 Units by mouth daily., Disp: , Rfl:   Social History   Tobacco Use  Smoking Status Light Smoker   Types: Cigarettes  Smokeless Tobacco Never    Allergies  Allergen Reactions   Acetaminophen  Itching   Objective:  There were no vitals filed for this visit. There is no height or weight on file to calculate BMI. Constitutional Well developed. Well nourished.  Vascular Dorsalis pedis pulses palpable bilaterally. Posterior tibial pulses palpable bilaterally. Capillary refill normal to all digits.  No cyanosis or clubbing noted. Pedal hair growth normal.  Neurologic Normal speech. Oriented to person, place, and time. Epicritic sensation to light touch grossly present bilaterally.  Dermatologic Nails thickened onychodystrophy mycotic toenails x 10 Skin skin epidermolysis and subjective complaint of itching noted.  No open wounds  or lesion noted.  Orthopedic: Normal joint ROM without pain or crepitus bilaterally. No visible deformities. No bony tenderness.   Radiographs: None Assessment:   1. Long-term use of high-risk medication   2. Other specified local infections of the skin and subcutaneous tissue   3. Onychomycosis due to dermatophyte    Plan:  Patient was evaluated and treated and all questions answered.  Bilateral athlete's foot - Given the amount of athlete's foot that is present patient will benefit from Lotrisone cream Lotrisone cream was sent to the pharmacy patient will apply twice a day.  Toenails x 10 onychomycosis/nail fungus -Educated the patient on the etiology of onychomycosis and various treatment options associated with improving the fungal load.  I explained to the patient that there is 3 treatment options available to treat the onychomycosis including topical, p.o., laser treatment.  Patient elected to undergo p.o. options with Lamisil/terbinafine therapy.  In order for me to start the medication therapy, I explained to the patient the importance of evaluating the liver and obtaining the liver function test.  Once the liver function test comes back normal I will start him on 86-month course of Lamisil therapy.  Patient understood all risk and would like to proceed with Lamisil therapy.  I have asked the patient to immediately stop the Lamisil therapy if she has any reactions to it and call the office or go to the emergency room right away.  Patient states understanding   No follow-ups on file.  Toenails x 10 LFT Lamisil  Athlete's foot Lotrisone cream

## 2023-09-25 LAB — HEPATIC FUNCTION PANEL
ALT: 15 IU/L (ref 0–32)
AST: 14 IU/L (ref 0–40)
Albumin: 4 g/dL (ref 3.8–4.9)
Alkaline Phosphatase: 101 IU/L (ref 44–121)
Bilirubin Total: <0.2 mg/dL (ref 0.0–1.2)
Bilirubin, Direct: <0.08 mg/dL (ref 0.00–0.40)
Total Protein: 5.9 g/dL — ABNORMAL LOW (ref 6.0–8.5)

## 2023-09-25 MED ORDER — TERBINAFINE HCL 250 MG PO TABS: 250.0000 mg | ORAL_TABLET | Freq: Every day | ORAL | 0 refills | Status: AC

## 2023-12-02 ENCOUNTER — Other Ambulatory Visit: Payer: Self-pay | Admitting: Podiatry

## 2023-12-02 ENCOUNTER — Telehealth: Payer: Self-pay | Admitting: Podiatry

## 2023-12-02 MED ORDER — TERBINAFINE HCL 250 MG PO TABS: 250.0000 mg | ORAL_TABLET | Freq: Every day | ORAL | 0 refills | Status: AC

## 2023-12-02 NOTE — Telephone Encounter (Signed)
 Patient was seen in [place] office in April.  Had blood work and has been waiting for a prescription for fungus on toes.  She follows up in August.  Please call about prescription.

## 2024-01-20 ENCOUNTER — Ambulatory Visit: Admitting: Podiatry

## 2024-03-15 ENCOUNTER — Ambulatory Visit: Admitting: Podiatry
